# Patient Record
Sex: Female | Born: 1985 | Hispanic: Yes | Marital: Married | Smoking: Never smoker
Health system: Southern US, Community
[De-identification: ages and names within clinical notes are randomized; demographics above are authoritative.]

## PROBLEM LIST (undated history)

## (undated) DIAGNOSIS — Z8489 Family history of other specified conditions: Secondary | ICD-10-CM

## (undated) DIAGNOSIS — R011 Cardiac murmur, unspecified: Secondary | ICD-10-CM

## (undated) DIAGNOSIS — G43909 Migraine, unspecified, not intractable, without status migrainosus: Secondary | ICD-10-CM

## (undated) DIAGNOSIS — I10 Essential (primary) hypertension: Secondary | ICD-10-CM

## (undated) HISTORY — PX: EYE SURGERY: SHX253

## (undated) HISTORY — PX: CHOLECYSTECTOMY: SHX55

## (undated) HISTORY — PX: TUBAL LIGATION: SHX77

---

## 2012-07-19 ENCOUNTER — Ambulatory Visit: Payer: Self-pay | Admitting: Family Medicine

## 2012-07-19 LAB — URINALYSIS, COMPLETE
Bilirubin,UR: NEGATIVE
Glucose,UR: NEGATIVE mg/dL (ref 0–75)
Nitrite: POSITIVE
Ph: 6.5 (ref 4.5–8.0)
Specific Gravity: 1.02 (ref 1.003–1.030)

## 2012-07-21 LAB — URINE CULTURE

## 2012-11-04 ENCOUNTER — Emergency Department: Payer: Self-pay | Admitting: Emergency Medicine

## 2012-11-04 LAB — COMPREHENSIVE METABOLIC PANEL WITH GFR
Albumin: 3.8 g/dL
Alkaline Phosphatase: 100 U/L
Anion Gap: 4 — ABNORMAL LOW
BUN: 17 mg/dL
Bilirubin,Total: 0.3 mg/dL
Calcium, Total: 9.2 mg/dL
Chloride: 105 mmol/L
Co2: 28 mmol/L
Creatinine: 0.76 mg/dL
EGFR (African American): >60
EGFR (Non-African Amer.): >60
Glucose: 94 mg/dL
Osmolality: 275
Potassium: 3.9 mmol/L
SGOT(AST): 58 U/L — ABNORMAL HIGH
SGPT (ALT): 39 U/L
Sodium: 137 mmol/L
Total Protein: 7.7 g/dL

## 2012-11-04 LAB — CBC
HCT: 39.5 %
HGB: 13.6 g/dL
MCH: 29.7 pg
MCHC: 34.5 g/dL
MCV: 86 fL
Platelet: 286 x10 3/mm 3
RBC: 4.6 X10 6/mm 3
RDW: 13.1 %
WBC: 12.3 x10 3/mm 3 — ABNORMAL HIGH

## 2012-11-04 LAB — URINALYSIS, COMPLETE
Bilirubin,UR: NEGATIVE
Glucose,UR: NEGATIVE mg/dL
Ketone: NEGATIVE
Leukocyte Esterase: NEGATIVE
Nitrite: NEGATIVE
Ph: 6
Protein: NEGATIVE
RBC,UR: 8 /HPF
Specific Gravity: 1.023
Squamous Epithelial: 11
WBC UR: <1 /HPF

## 2012-11-04 LAB — LIPASE, BLOOD: Lipase: 110 U/L

## 2013-07-02 ENCOUNTER — Ambulatory Visit: Payer: Self-pay

## 2014-01-30 ENCOUNTER — Ambulatory Visit: Payer: Self-pay | Admitting: Surgery

## 2014-02-05 ENCOUNTER — Ambulatory Visit: Payer: Self-pay | Admitting: Surgery

## 2014-06-29 NOTE — Op Note (Signed)
PATIENT NAME:  Diane Sanchez, Diane Sanchez MR#:  [id number] DATE OF BIRTH:  1985/10/05  DATE OF PROCEDURE:  02/05/2014  PREOPERATIVE DIAGNOSIS:  Chronic cholecystitis/cholelithiasis.   POSTOPERATIVE DIAGNOSIS:  Chronic cholecystitis/cholelithiasis.   PROCEDURE:  Laparoscopic cholecystectomy, cholangiogram.   SURGEON:  Adella HareJ. Wilton Ferguson Gertner, MD   ANESTHESIA:  General.   INDICATION:  This 29 year old female has a history of epigastric pain and history of ultrasound findings of small gallstones. Surgery was recommended for definitive treatment.   DESCRIPTION OF PROCEDURE:  The patient was placed on the operating table in the supine position under general endotracheal anesthesia. The abdomen was prepared with ChloraPrep and draped in a sterile manner. A short incision was made in the inferior aspect of the umbilicus and carried down to the deep fascia, which was grasped with laryngeal hook and elevated. A Veress needle was inserted, aspirated, and irrigated with a saline solution. Next, the peritoneal cavity was inflated with carbon dioxide. The Veress needle was removed. The 10 mm cannula was inserted. The 10 mm, 0-degree laparoscope was inserted to view the peritoneal cavity. Another incision was made in the epigastrium slightly to the right of the midline to introduce an 11 mm cannula. Two incisions were made in the lateral aspect of the right upper quadrant to introduce two 5 mm cannulas.   Initial inspection revealed normal appearance of the liver and some thickening of the gallbladder wall. Other visible intestines appeared typical. The gallbladder was retracted towards the right shoulder. Multiple adhesions were taken down with blunt and sharp dissection and hook and cautery. The gallbladder neck was retracted inferiorly and laterally. The porta hepatis was observed. The cystic duct was dissected free from surrounding structures. The cystic artery was dissected free from surrounding structures. A critical view of  safety was demonstrated. An Endo Clip was placed across the cystic duct adjacent to the gallbladder neck. An incision was made in the cystic duct to introduce a Reddick catheter. Half-strength Conray-60 dye was injected as the cholangiogram was done with fluoroscopy, viewing the biliary tree and prompt flow of dye into the duodenum. No retained stones were seen. The Reddick catheter was removed. The cystic duct was doubly ligated with Endo Clips and divided. The cystic artery was controlled with Endo Clip and divided. The gallbladder was dissected free from the liver with use of hook and cautery. Bleeding was very minimal. Hemostasis was subsequently intact. The gallbladder was delivered up through the infraumbilical incision, opened and suctioned, removed, and submitted in formalin with palpable small stones for routine pathology. The right upper quadrant was further inspected. Hemostasis was intact. The cannulas were removed, allowing carbon dioxide to escape from the peritoneal cavity. Skin incisions were closed with interrupted 5-0 chromic subcuticular suture, benzoin, and Steri-Strips. Dressings were applied with paper tape. The patient tolerated the surgery satisfactorily and was prepared for transfer to the recovery room.    ____________________________ Shela CommonsJ. Renda RollsWilton Timesha Cervantez, MD jws:nb D: 02/05/2014 17:18:08 ET T: 02/06/2014 02:17:06 ET JOB#: [id number]  cc: Adella HareJ. Wilton Edan Juday, MD, <Dictator> Adella HareWILTON J Royer Cristobal MD ELECTRONICALLY SIGNED 02/12/2014 8:55

## 2014-07-01 LAB — SURGICAL PATHOLOGY

## 2014-09-02 IMAGING — CT CT HEAD WITHOUT CONTRAST
1 series · 16 of 30 positions shown, 20 images · non-contrast
Comparison: None.

REASON FOR EXAM: loc migraine
COMMENTS:

PROCEDURE:     JANNA - MALLELIS VALVUENA WITHOUT CONTRAST  - July 02, 2013  [DATE]
CLINICAL DATA: History migraines, headache
EXAM:
CT HEAD WITHOUT CONTRAST
TECHNIQUE: Contiguous axial images were obtained from the base of the skull
through the vertex without intravenous contrast.

[Series 2: head wo · axial · 0.40mm/px · z∈[+208,+353]mm · 16 of 33 slices shown, 20 images]
[im 2/33  brain]
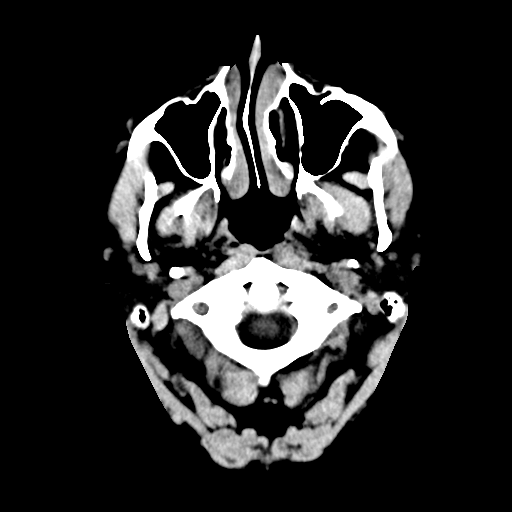
[im 2/33  bone]
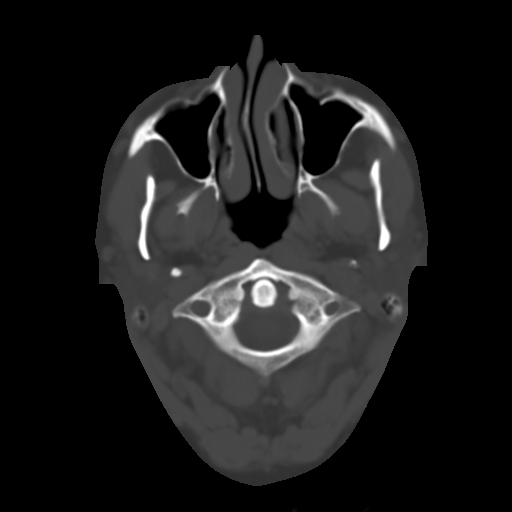
[im 4/33  brain]
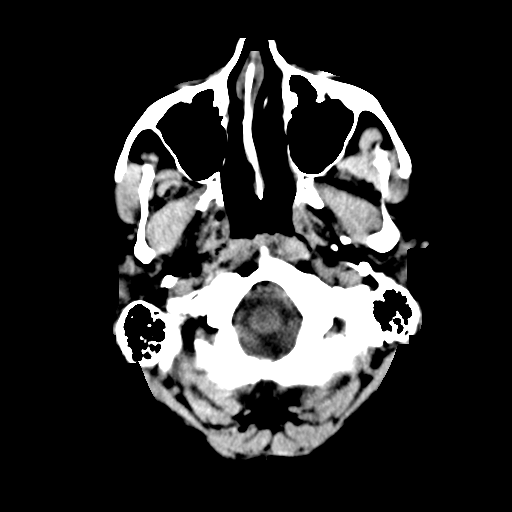
[im 6/33  brain]
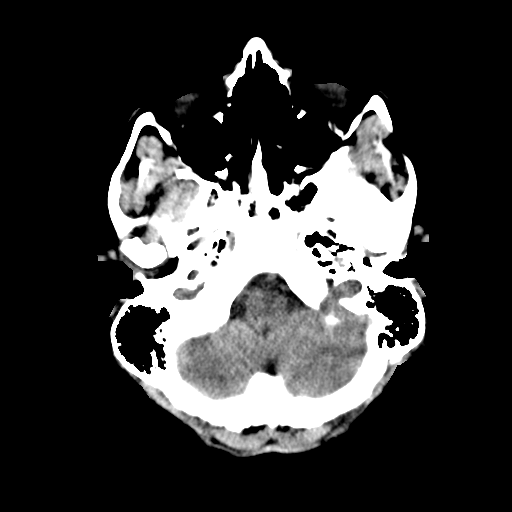
[im 8/33  brain]
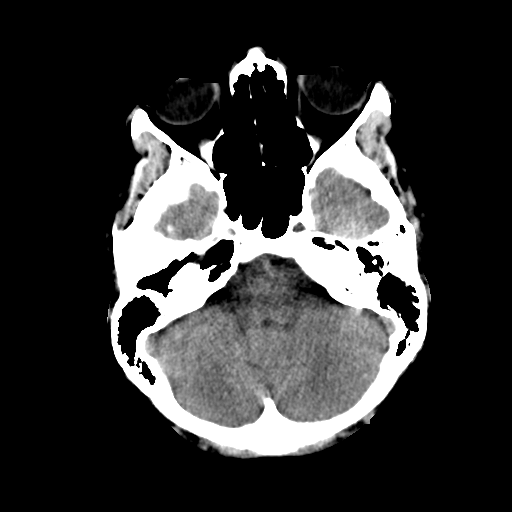
[im 9/33  brain]
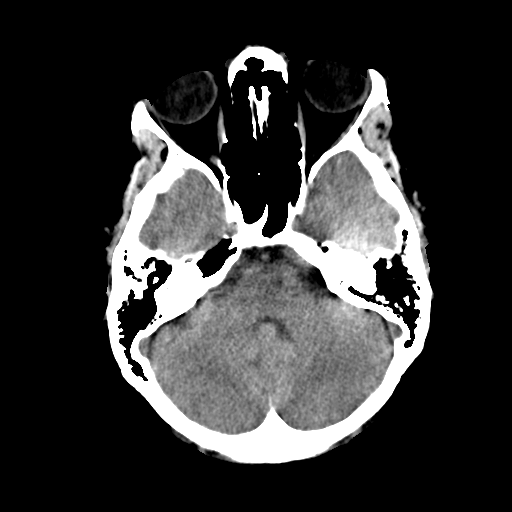
[im 9/33  bone]
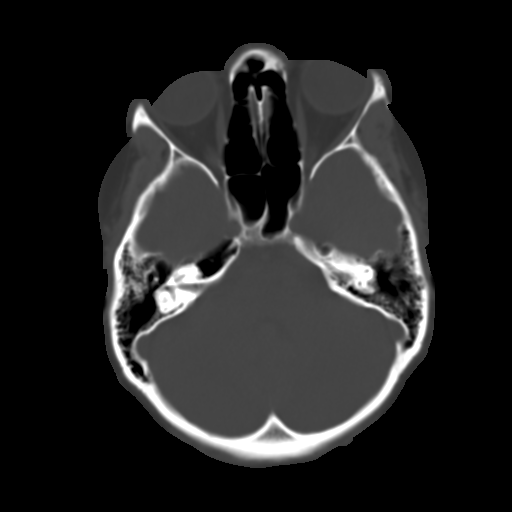
[im 12/33  brain]
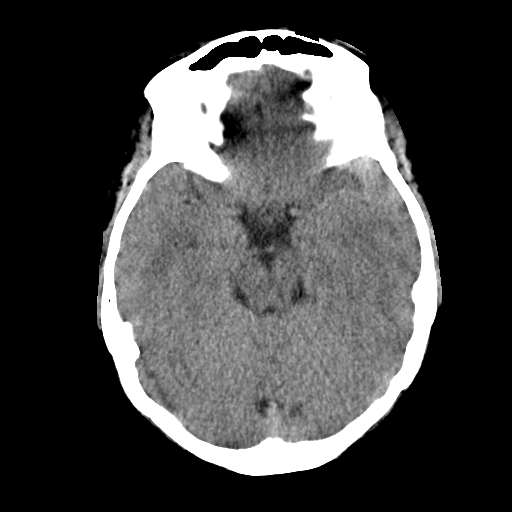
[im 14/33  brain]
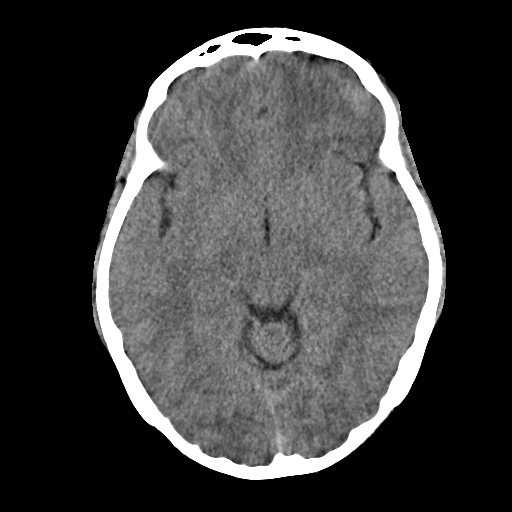
[im 16/33  brain]
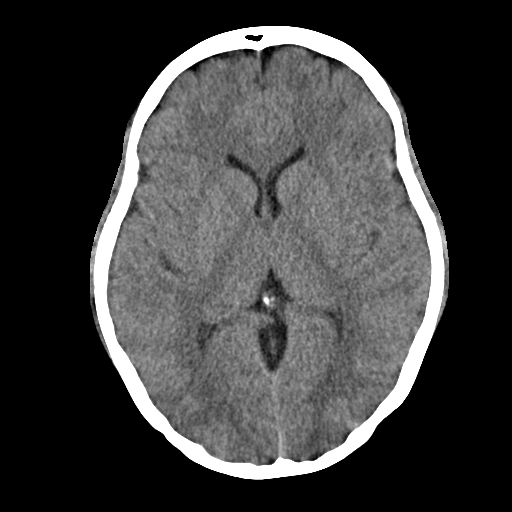
[im 17/33  brain]
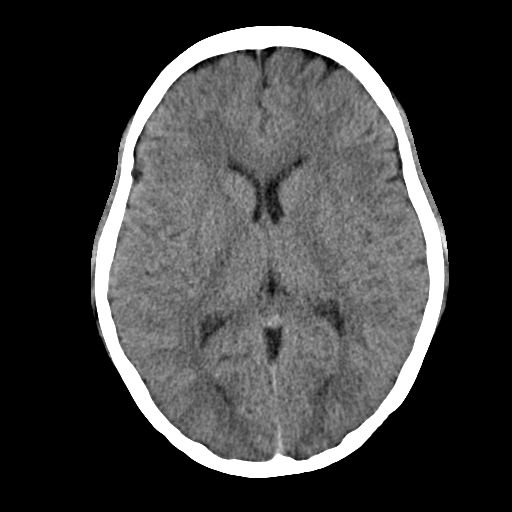
[im 17/33  bone]
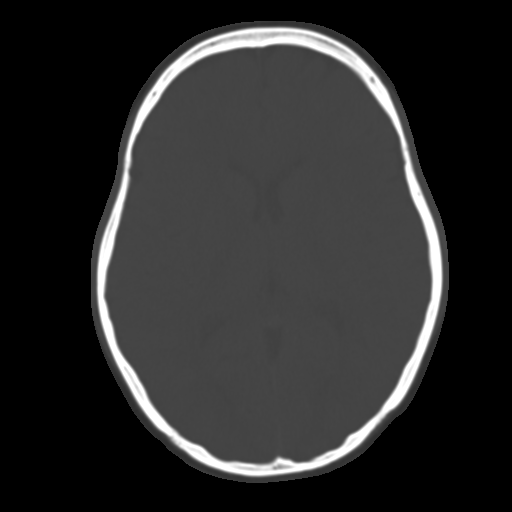
[im 19/33  brain]
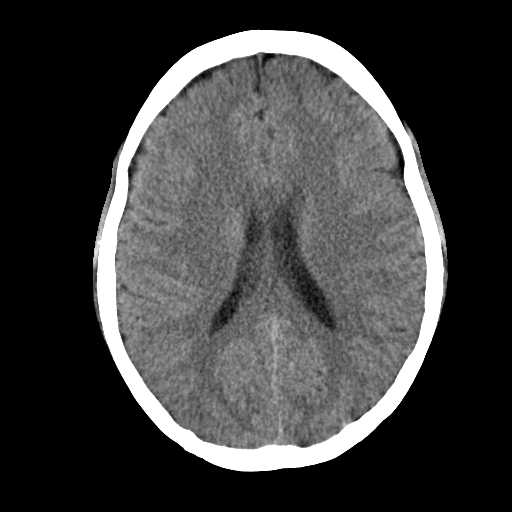
[im 21/33  brain]
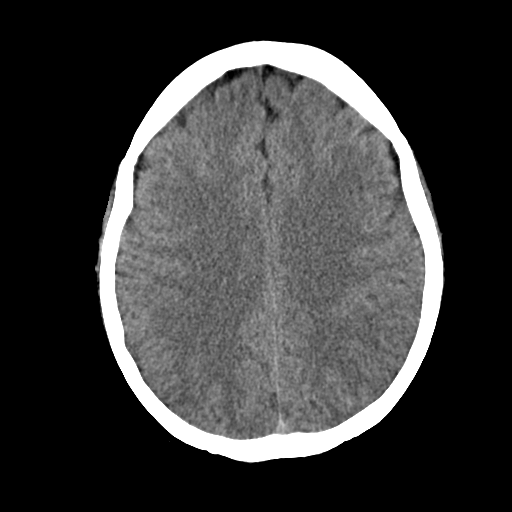
[im 24/33  brain]
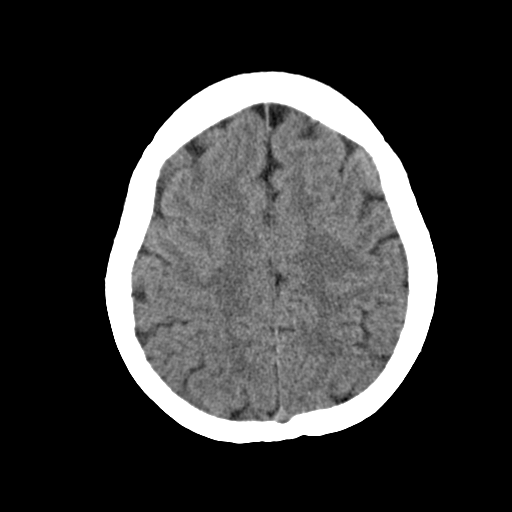
[im 25/33  brain]
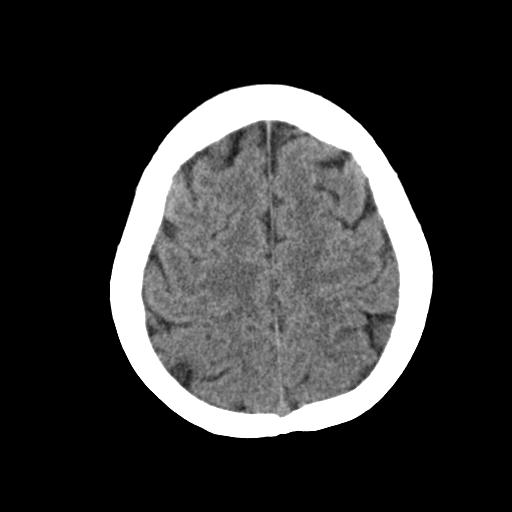
[im 25/33  bone]
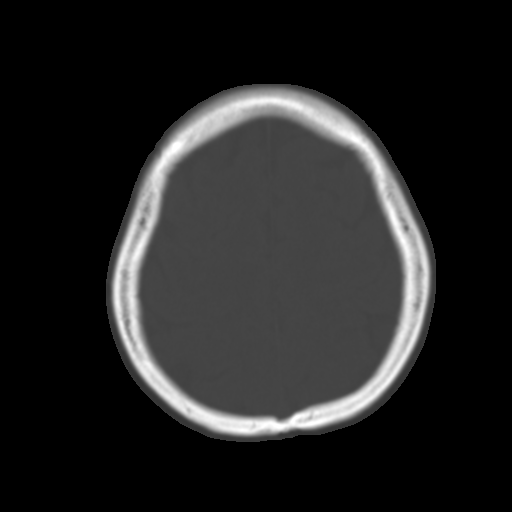
[im 27/33  brain]
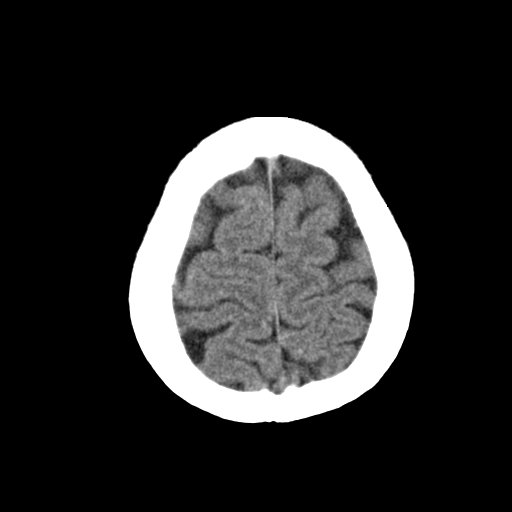
[im 29/33  brain]
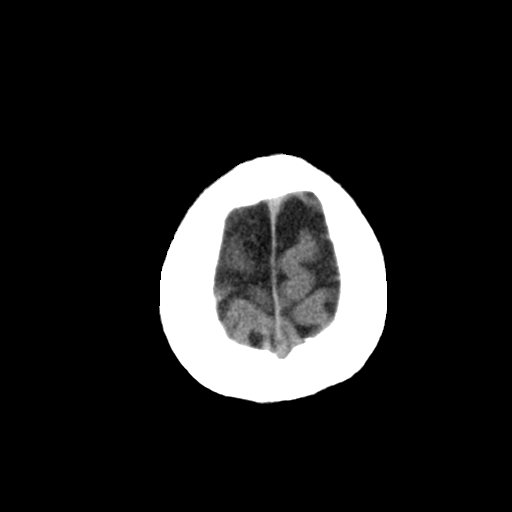
[im 31/33  brain]
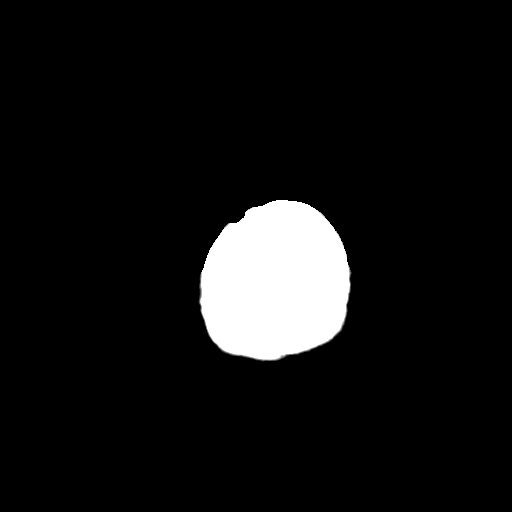

[16 of 30 positions shown; findings below may reference images not displayed]

FINDINGS: There is no evidence of mass effect, midline shift or extra-axial
fluid collections. There is no evidence of a space-occupying lesion
or intracranial hemorrhage. There is no evidence of a cortical-based
area of acute infarction.

The ventricles and sulci are appropriate for the patient's age. The
basal cisterns are patent.

Visualized portions of the orbits are unremarkable. The visualized
portions of the paranasal sinuses and mastoid air cells are
unremarkable.

The osseous structures are unremarkable.
IMPRESSION: No acute intracranial pathology.

## 2015-04-08 IMAGING — CR DG CHOLANGIOGRAM OPERATIVE
1 series · 3 of 3 positions shown · non-contrast
Comparison: None.

CLINICAL DATA: Cholelithiasis.

EXAM:
INTRAOPERATIVE CHOLANGIOGRAM
TECHNIQUE: Cholangiographic images from the C-arm fluoroscopic device were
submitted for interpretation post-operatively. Please see the
procedural report for the amount of contrast and the fluoroscopy
time utilized.

[Series 6001: s1 · 3 of 3 slices shown]
[im 1/3]
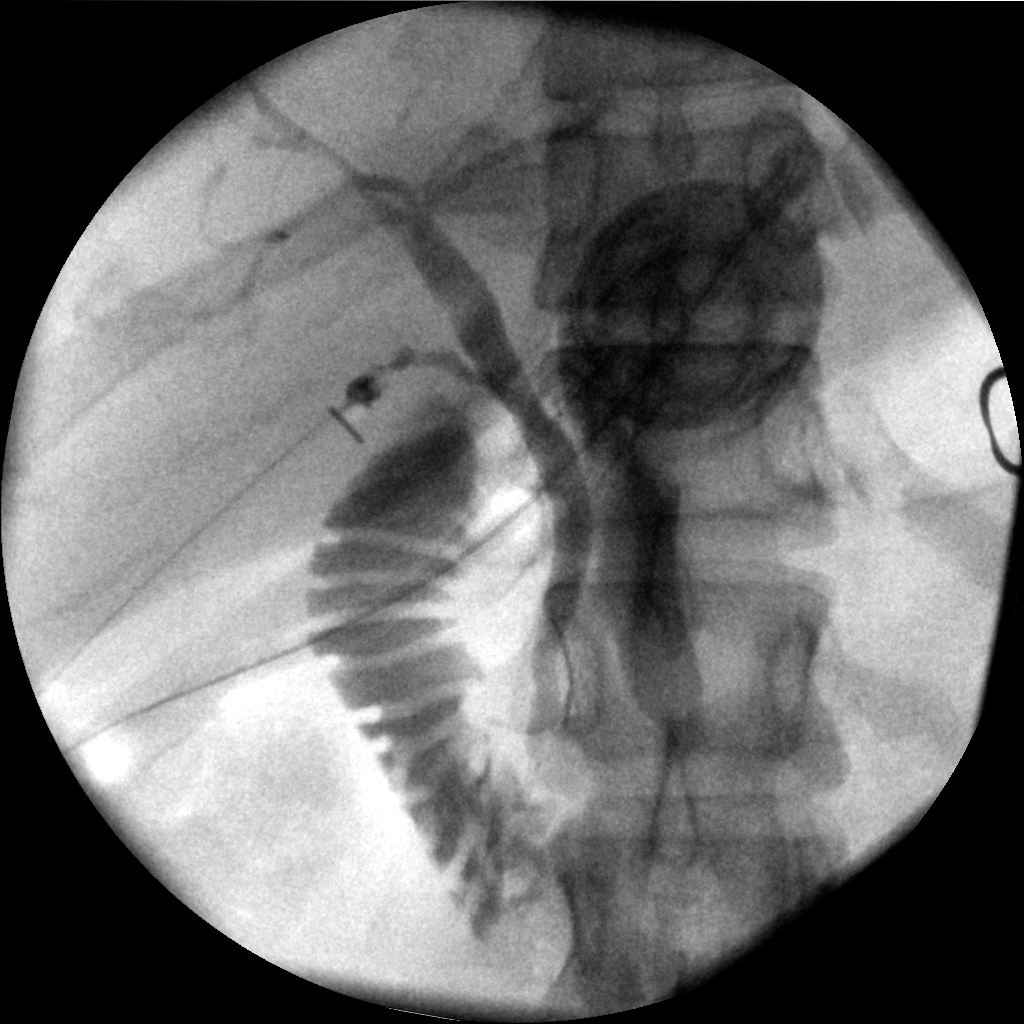
[im 2/3]
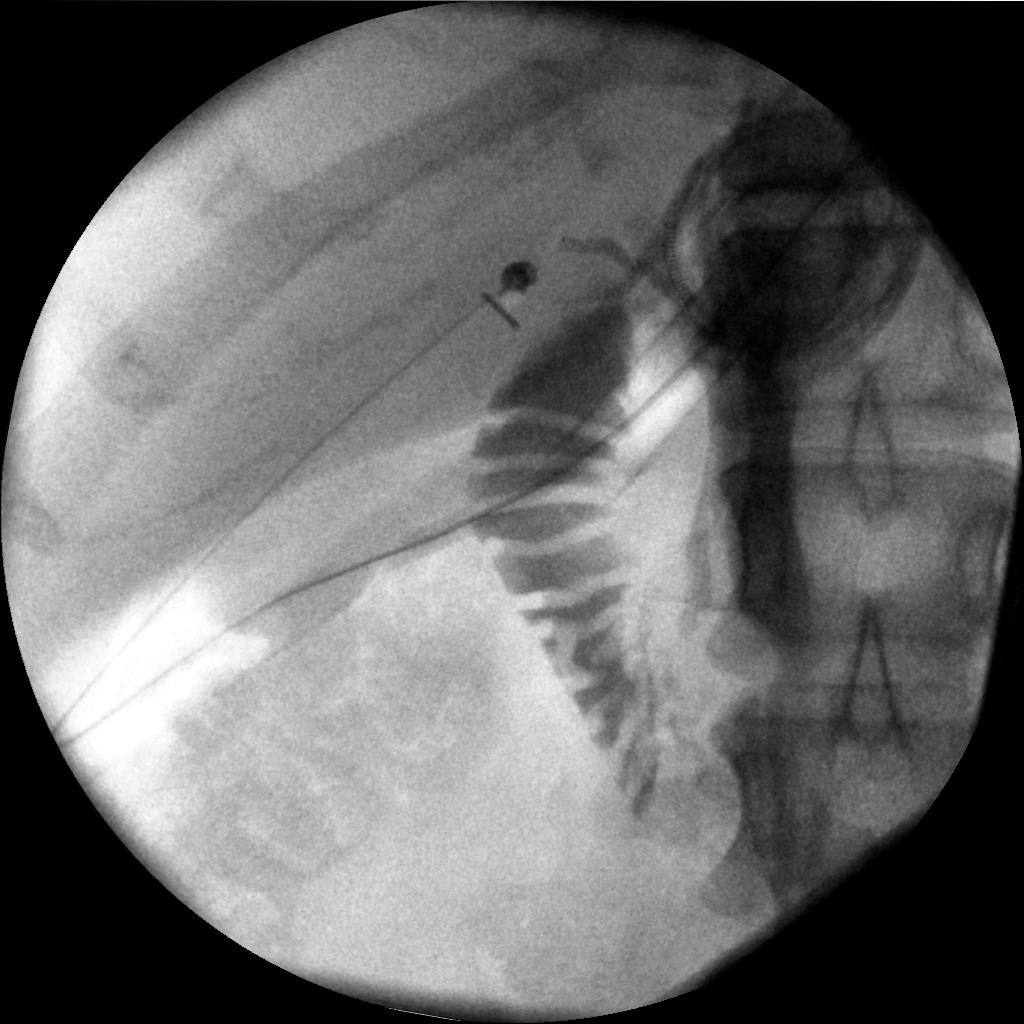
[im 3/3]
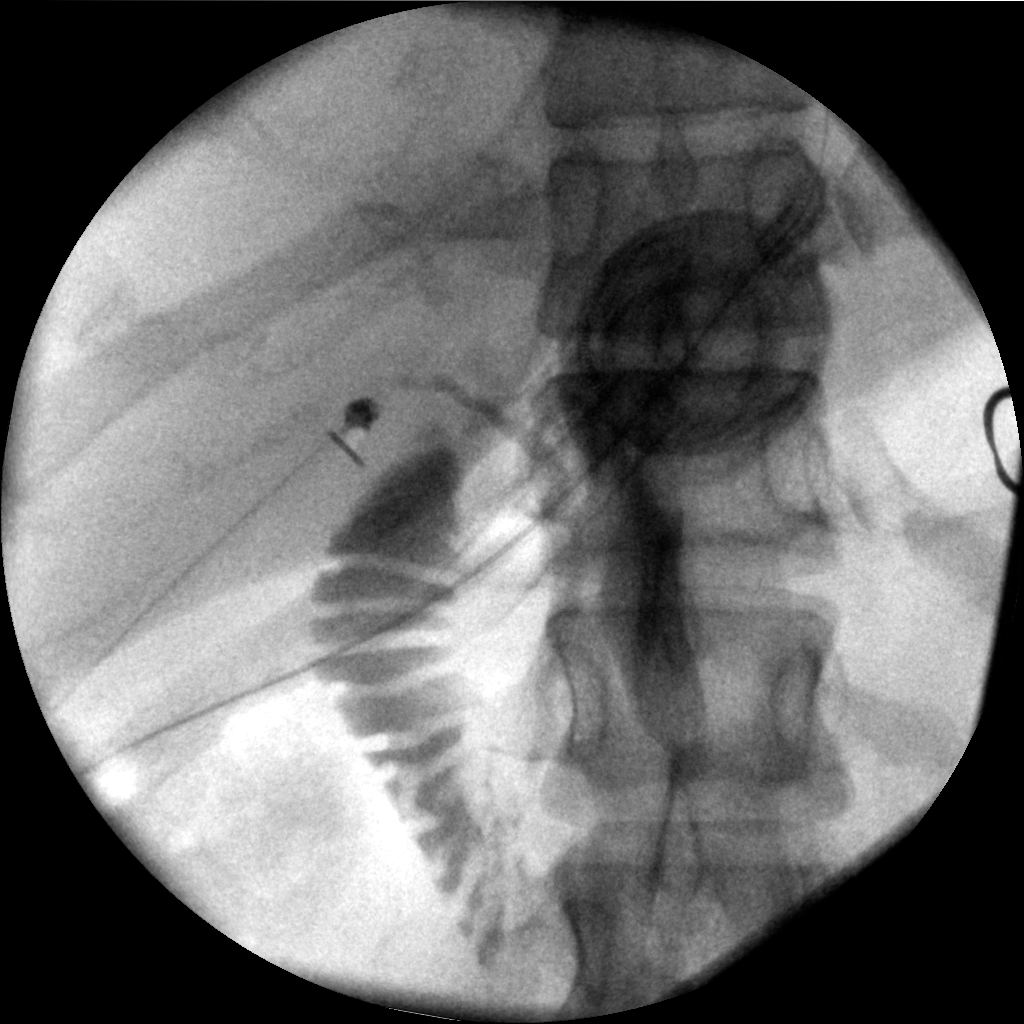

[3 of 3 positions shown; findings below may reference images not displayed]

FINDINGS: A total of 3 images are provided. I was not present during the
injection.

The series of images appears to demonstrate cannulation and
injection of the cystic duct remnant. There is contrast medium in
the bowel on all 3 images. The third image demonstrates
opacification of the common bile duct, common hepatic duct, and
proximal intrahepatic biliary tree without a well seen filling
defect.
IMPRESSION: 1. No definite filling defect identified on the third image, which
is the image which demonstrates a reasonably well opacified
extrahepatic biliary system.

## 2020-06-18 ENCOUNTER — Other Ambulatory Visit: Payer: Self-pay | Admitting: Podiatry

## 2020-06-19 ENCOUNTER — Encounter: Payer: Self-pay | Admitting: Podiatry

## 2020-06-19 ENCOUNTER — Other Ambulatory Visit: Payer: Self-pay

## 2020-06-24 ENCOUNTER — Other Ambulatory Visit: Admission: RE | Admit: 2020-06-24 | Payer: 59 | Source: Ambulatory Visit

## 2020-07-17 ENCOUNTER — Encounter: Payer: Self-pay | Admitting: Podiatry

## 2020-07-17 ENCOUNTER — Ambulatory Visit
Admission: RE | Disposition: A | Discharge status: Home / Self Care | Payer: Self-pay | Source: Home / Self Care | Attending: Podiatry

## 2020-07-17 ENCOUNTER — Ambulatory Visit: Payer: 59 | Admitting: Anesthesiology

## 2020-07-17 ENCOUNTER — Ambulatory Visit
Admission: RE | Admit: 2020-07-17 | Discharge: 2020-07-17 | Disposition: A | Discharge status: Home / Self Care | Payer: 59 | Attending: Podiatry | Admitting: Podiatry

## 2020-07-17 ENCOUNTER — Other Ambulatory Visit: Payer: Self-pay

## 2020-07-17 DIAGNOSIS — Z09 Encounter for follow-up examination after completed treatment for conditions other than malignant neoplasm: Secondary | ICD-10-CM

## 2020-07-17 DIAGNOSIS — Z9049 Acquired absence of other specified parts of digestive tract: Secondary | ICD-10-CM | POA: Diagnosis not present

## 2020-07-17 DIAGNOSIS — M2012 Hallux valgus (acquired), left foot: Secondary | ICD-10-CM | POA: Insufficient documentation

## 2020-07-17 DIAGNOSIS — Z79899 Other long term (current) drug therapy: Secondary | ICD-10-CM | POA: Insufficient documentation

## 2020-07-17 HISTORY — DX: Cardiac murmur, unspecified: R01.1

## 2020-07-17 HISTORY — DX: Family history of other specified conditions: Z84.89

## 2020-07-17 HISTORY — PX: BUNIONECTOMY: SHX129

## 2020-07-17 HISTORY — DX: Migraine, unspecified, not intractable, without status migrainosus: G43.909

## 2020-07-17 LAB — POCT PREGNANCY, URINE: Preg Test, Ur: NEGATIVE

## 2020-07-17 SURGERY — BUNIONECTOMY
Anesthesia: General | Site: Toe | Laterality: Left

## 2020-07-17 MED ORDER — POVIDONE-IODINE 7.5 % EX SOLN: Freq: Once | CUTANEOUS | Status: AC

## 2020-07-17 MED ORDER — ONDANSETRON HCL 4 MG/2ML IJ SOLN: 4.0000 mg | Freq: Once | INTRAMUSCULAR | Status: DC | PRN

## 2020-07-17 MED ORDER — CEPHALEXIN 500 MG PO CAPS: 500.0000 mg | ORAL_CAPSULE | Freq: Three times a day (TID) | ORAL | 0 refills | Status: AC

## 2020-07-17 MED ORDER — ASPIRIN EC 81 MG PO TBEC: 81.0000 mg | DELAYED_RELEASE_TABLET | Freq: Two times a day (BID) | ORAL | 0 refills | Status: AC

## 2020-07-17 MED ORDER — SCOPOLAMINE 1 MG/3DAYS TD PT72
1.0000 | MEDICATED_PATCH | Freq: Once | TRANSDERMAL | Status: DC
  Administered 2020-07-17: 1.5 mg via TRANSDERMAL

## 2020-07-17 MED ORDER — ONDANSETRON HCL 4 MG PO TABS: 4.0000 mg | ORAL_TABLET | Freq: Three times a day (TID) | ORAL | 0 refills | Status: AC | PRN

## 2020-07-17 MED ORDER — FENTANYL CITRATE (PF) 100 MCG/2ML IJ SOLN: 25.0000 ug | INTRAMUSCULAR | Status: DC | PRN

## 2020-07-17 MED ORDER — FENTANYL CITRATE (PF) 100 MCG/2ML IJ SOLN
INTRAMUSCULAR | Status: DC | PRN
  Administered 2020-07-17: 25 ug via INTRAVENOUS
  Administered 2020-07-17: 50 ug via INTRAVENOUS
  Administered 2020-07-17: 25 ug via INTRAVENOUS

## 2020-07-17 MED ORDER — OXYCODONE HCL 5 MG/5ML PO SOLN: 5.0000 mg | Freq: Once | ORAL | Status: DC | PRN

## 2020-07-17 MED ORDER — LIDOCAINE HCL (CARDIAC) PF 100 MG/5ML IV SOSY
PREFILLED_SYRINGE | INTRAVENOUS | Status: DC | PRN
  Administered 2020-07-17: 50 mg via INTRATRACHEAL

## 2020-07-17 MED ORDER — ACETAMINOPHEN 325 MG PO TABS
325.0000 mg | ORAL_TABLET | ORAL | Status: DC | PRN
Start: 2020-07-17 — End: 2020-07-17

## 2020-07-17 MED ORDER — OXYCODONE HCL 5 MG PO TABS: 5.0000 mg | ORAL_TABLET | Freq: Once | ORAL | Status: DC | PRN

## 2020-07-17 MED ORDER — ACETAMINOPHEN 160 MG/5ML PO SOLN: 325.0000 mg | ORAL | Status: DC | PRN

## 2020-07-17 MED ORDER — CEFAZOLIN SODIUM-DEXTROSE 2-4 GM/100ML-% IV SOLN
2.0000 g | INTRAVENOUS | Status: AC
  Administered 2020-07-17: 2 g via INTRAVENOUS

## 2020-07-17 MED ORDER — GLYCOPYRROLATE 0.2 MG/ML IJ SOLN
INTRAMUSCULAR | Status: DC | PRN
  Administered 2020-07-17: .1 mg via INTRAVENOUS

## 2020-07-17 MED ORDER — BUPIVACAINE LIPOSOME 1.3 % IJ SUSP
INTRAMUSCULAR | Status: DC | PRN
  Administered 2020-07-17: 5 mL
  Administered 2020-07-17: 10 mL

## 2020-07-17 MED ORDER — MIDAZOLAM HCL 5 MG/5ML IJ SOLN
INTRAMUSCULAR | Status: DC | PRN
  Administered 2020-07-17: 2 mg via INTRAVENOUS

## 2020-07-17 MED ORDER — LACTATED RINGERS IV SOLN: INTRAVENOUS | Status: DC

## 2020-07-17 MED ORDER — ONDANSETRON HCL 4 MG/2ML IJ SOLN
INTRAMUSCULAR | Status: DC | PRN
  Administered 2020-07-17: 4 mg via INTRAVENOUS

## 2020-07-17 MED ORDER — HYDROCODONE-ACETAMINOPHEN 7.5-325 MG PO TABS: 1.0000 | ORAL_TABLET | Freq: Four times a day (QID) | ORAL | 0 refills | Status: AC | PRN

## 2020-07-17 MED ORDER — DEXAMETHASONE SODIUM PHOSPHATE 4 MG/ML IJ SOLN
INTRAMUSCULAR | Status: DC | PRN
  Administered 2020-07-17: 4 mg via INTRAVENOUS

## 2020-07-17 MED ORDER — PROPOFOL 10 MG/ML IV BOLUS
INTRAVENOUS | Status: DC | PRN
  Administered 2020-07-17: 150 mg via INTRAVENOUS
  Administered 2020-07-17: 20 mg via INTRAVENOUS
  Administered 2020-07-17: 30 mg via INTRAVENOUS

## 2020-07-17 MED ORDER — ASPIRIN EC 81 MG PO TBEC: 81.0000 mg | DELAYED_RELEASE_TABLET | Freq: Two times a day (BID) | ORAL | 0 refills | Status: DC

## 2020-07-17 MED ORDER — BUPIVACAINE HCL 0.25 % IJ SOLN
INTRAMUSCULAR | Status: DC | PRN
  Administered 2020-07-17: 10 mL

## 2020-07-17 SURGICAL SUPPLY — 43 items
BIT DRILL 1.7 LNG CANN (DRILL) ×2 IMPLANT
BLADE MED AGGRESSIVE (BLADE) ×2 IMPLANT
BLADE OSC/SAGITTAL MD 5.5X18 (BLADE) ×2 IMPLANT
BNDG CMPR STD VLCR NS LF 5.8X4 (GAUZE/BANDAGES/DRESSINGS) ×1
BNDG CMPR STD VLCR NS LF 5.8X6 (GAUZE/BANDAGES/DRESSINGS) ×1
BNDG ELASTIC 4X5.8 VLCR NS LF (GAUZE/BANDAGES/DRESSINGS) ×2 IMPLANT
BNDG ELASTIC 6X5.8 VLCR NS LF (GAUZE/BANDAGES/DRESSINGS) ×2 IMPLANT
BNDG ESMARK 4X12 TAN STRL LF (GAUZE/BANDAGES/DRESSINGS) ×2 IMPLANT
BNDG GAUZE 4.5X4.1 6PLY STRL (MISCELLANEOUS) ×2 IMPLANT
CANISTER SUCT 1200ML W/VALVE (MISCELLANEOUS) ×2 IMPLANT
COUNTERSINK HEADED 2.5 (ORTHOPEDIC DISPOSABLE SUPPLIES) ×2
COVER LIGHT HANDLE FLEXIBLE (MISCELLANEOUS) ×4 IMPLANT
CUFF TOURN SGL QUICK 18X4 (TOURNIQUET CUFF) ×2 IMPLANT
DRAPE FLUOR MINI C-ARM 54X84 (DRAPES) ×2 IMPLANT
DURAPREP 26ML APPLICATOR (WOUND CARE) ×2 IMPLANT
ELECT REM PT RETURN 9FT ADLT (ELECTROSURGICAL) ×2
ELECTRODE REM PT RTRN 9FT ADLT (ELECTROSURGICAL) ×1 IMPLANT
GAUZE SPONGE 4X4 12PLY STRL (GAUZE/BANDAGES/DRESSINGS) ×2 IMPLANT
GAUZE XEROFORM 1X8 LF (GAUZE/BANDAGES/DRESSINGS) ×2 IMPLANT
GLOVE SURG ENC MOIS LTX SZ7 (GLOVE) ×2 IMPLANT
GLOVE SURG POLYISO LF SZ7 (GLOVE) ×2 IMPLANT
GOWN STRL REUS W/ TWL LRG LVL3 (GOWN DISPOSABLE) ×2 IMPLANT
GOWN STRL REUS W/TWL LRG LVL3 (GOWN DISPOSABLE) ×4
KIT PROCEDURE DRILL (DRILL) ×2 IMPLANT
KIT TURNOVER KIT A (KITS) ×2 IMPLANT
NS IRRIG 500ML POUR BTL (IV SOLUTION) ×2 IMPLANT
PACK EXTREMITY ARMC (MISCELLANEOUS) ×2 IMPLANT
PADDING CAST BLEND 4X4 NS (MISCELLANEOUS) ×8 IMPLANT
PENCIL SMOKE EVACUATOR (MISCELLANEOUS) ×2 IMPLANT
SCREW 2.5X24 HEADED (Screw) ×2 IMPLANT
SCREW COUNTERSINK HEADED 2.5 (ORTHOPEDIC DISPOSABLE SUPPLIES) ×1 IMPLANT
SPLINT CAST 1 STEP 4X30 (MISCELLANEOUS) ×2 IMPLANT
STAPLE DYNACLIP 8X8 (Staple) ×2 IMPLANT
STOCKINETTE IMPERVIOUS LG (DRAPES) ×2 IMPLANT
SUT ETHILON 4-0 (SUTURE) ×4
SUT ETHILON 4-0 FS2 18XMFL BLK (SUTURE) ×2
SUT VIC AB 4-0 FS2 27 (SUTURE) ×2 IMPLANT
SUT VIC AB 4-0 RB1 27 (SUTURE) ×2
SUT VIC AB 4-0 RB1 27X BRD (SUTURE) ×1 IMPLANT
SUT VIC AB 4-0 SH 27 (SUTURE) ×2
SUT VIC AB 4-0 SH 27XANBCTRL (SUTURE) ×1 IMPLANT
SUTURE ETHLN 4-0 FS2 18XMF BLK (SUTURE) ×2 IMPLANT
WIRE SMOOTH TROCAR .9MMX150MML (WIRE) ×4 IMPLANT

## 2020-07-17 NOTE — Op Note (Signed)
PODIATRY / FOOT AND ANKLE SURGERY OPERATIVE REPORT    SURGEON: Caroline More, DPM  PRE-OPERATIVE DIAGNOSIS:  1.  Left hallux valgus 2.  Pain left foot  POST-OPERATIVE DIAGNOSIS: Same  PROCEDURE(S): Left Austin bunionectomy with Akin osteotomy  HEMOSTASIS: Left ankle tourniquet  ANESTHESIA: MAC  ESTIMATED BLOOD LOSS: 10 cc  FINDING(S): 1.  Hallux valgus contracture left foot with increased hallux interphalangeus angle and first intermetatarsal space angle.  PATHOLOGY/SPECIMEN(S): None  INDICATIONS:   Diane Sanchez is a 35 y.o. female who presents with a painful bunion to the left foot.  Patient has tried conservative measures but has not improved.  She has tried changing shoes, orthoses, taping, padding, strapping, oral medications but has failed to improve.  X-ray imaging was taken prior to surgery today showing increased intermetatarsal space angle to approximately 11 degrees and increased hallux interphalangeus angle with increased medial eminence as well.  Discussed all treatment options with patient both conservative and surgical attempts at correction at this time patient is elected for surgical procedure consisting of left Austin bunionectomy with Akin osteotomy.  Postoperative course discussed in detail as well.  All questions answered.  No guarantees given.  Consent signed and placed in chart..  DESCRIPTION: After obtaining full informed written consent, the patient was brought back to the operating room and placed supine upon the operating table.  The patient received IV antibiotics prior to induction.  After obtaining adequate anesthesia, a block was performed with a mix of 10 cc of half percent Marcaine plain and 10 cc of Exparel about the left first ray in a Mayo type block and first interspace.  The patient was prepped and draped in the standard fashion.  An Esmarch bandage was used to exsanguinate the left lower extremity and pneumatic ankle tourniquet was  inflated.  Attention was directed to the left first metatarsal phalangeal joint at the dorsal medial aspect where a linear longitudinal incision was made medial to the tendon of the extensor hallucis longus and involved the contour of the deformity.  The incision was deepened through the subcutaneous tissues utilizing sharp blunt dissection care was taken to identify and retract all vital neurovascular structures all venous contributories were cauterized necessary.  At this time the capsule was then incised medial to the tendon of the extensor hallucis longus and once again involved the contour deformity where the capsular and periosteal tissues were reflected medially and laterally thereby exposing the first metatarsal phalangeal joint at the operative site as well as the base of the proximal phalanx and midshaft portion of the proximal phalanx of the hallux.  At this time the medial eminence was then resected and passed off the operative site.  A capsular release was then performed a lateral release was performed at the lateral aspect of the joint through the same incision while retracting the extensor hallucis longus tendon medially and the skin and subcutaneous tissue laterally.  The conjoined tendon of the abductor hallucis was resected as well as the lateral capsular tissues completing the lateral release.  Attention was then directed to the head of the first metatarsal where the osteotomy site was marked.  A Austin osteotomy was performed leaving a slightly longer plantar arm the dorsal arm.  The capital fragment was shifted laterally approximately 5 mm and then held into place with a guidewire for a 2.7 mm cannulated Paragon 28 screw.  C-arm imaging was utilized to verify correct position which appeared to be excellent as the intermetatarsal space angle appeared to  be well reduced.  The sesamoids also appear to be underneath the first metatarsal head and the hallux appeared to be in a fairly rectus  position but still had increased hallux interphalangeus angle indicating that likely in Akin osteotomy as needed.  Once the guidewire was verified to be in the correct position under fluoroscopic guidance utilizing standard AO principles and techniques a 2.7 x 24 mm cannulated Paragon 28 partially-threaded headed screw was then placed across the osteotomy site with excellent compression noted.  The guidewire was removed and passed off the operative site.  The medial overhang was then resected with a sagittal bone saw in the medial area was then smoothed to a smooth contour with the sagittal bone saw.  These bone fragments were passed off the operative site.  Surgical site was flushed with copious amounts normal sterile saline.   Attention was then directed to the hallux at the proximal phalanx midshaft portion were dissection was performed previously and an Akin type osteotomy was performed keeping the lateral hinge intact removing a 3 to 4 mm bone fragment.  This was done with the aid of a guidepin.  The sagittal bone saw was used to complete the osteotomy and the bone fragment was removed along with a guidepin and then the cut was then finished with the sagittal bone saw the wall trying to push the toe into a reduced position.  The lateral hinge remained intact during this portion of the case.  At this time an 8 mm Paragon 28 staple was then placed from dorsal to plantar across the osteotomy site while holding the toe in a reduced position.  It appeared to sit excellently and had excellent compression across the osteotomy site.  With the foot loaded the toe appeared to sit in a rectus position overall with great reduction of the medial eminence and the hallux appeared to be in a fairly straight position overall.  The surgical sites were flushed with copious amounts normal sterile saline.  The capsular and periosteal tissue was then reapproximated well coapted with the toe held in a rectus position with 3-0  Vicryl.  Final C-arm imaging was then taken showing rectus position of the hallux with reduction of the first intermetatarsal space angle and hallux interphalangeus angle with hardware intact.  The osteotomies appear to be well compressed overall.  Subcutaneous tissues and reapproximated well coapted with 4-0 Vicryl and the skin was then reapproximated well coapted with 4-0 nylon in a combination of simple and horizontal mattress type stitching.  An additional 5 cc of Exparel was injected about the incision site and operative sites.  The pneumatic ankle tourniquet was deflated and a prompt hyperemic response was noted all digits left foot.  A postoperative dressing was applied consisting of Xeroform to the incision line followed by 4 x 4 gauze, Kling, Kerlix, Ace wrap, patient was placed into a tall cam boot.  The patient tolerated the procedure and anesthesia well was transferred to recovery room vital signs stable vascular status intact all toes left foot.  Following.  Postoperative monitoring patient will be discharged home with the appropriate orders, medications, and follow-up instructions.  Patient is to remain partial weightbearing with a cam boot with heel contact for short distances only and try to stay off the foot is much as possible.  Discussed this postoperatively with husband in detail.  Patient to follow-up in clinic within 1 week of surgical date.  COMPLICATIONS: None  CONDITION: Good, stable  Caroline More, DPM

## 2020-07-17 NOTE — H&P (Signed)
PODIATRY / FOOT AND ANKLE SURGERY H&P  Chief Complaint: Left foot bunion pain   HPI: Diane Sanchez is a 35 y.o. female who presents with pain to the left first metatarsal phalangeal joint area bony deformity.  Patient has exhausted conservative measures consisting of changes in shoes, orthoses, padding, strapping, taping, injections, changes in activity but her symptoms have still been present and she still continued to have pain at the level of the first metatarsal phalangeal joint near the bunion.  Patient presents today for surgical intervention.  PMHx:  Past Medical History:  Diagnosis Date  . Family history of adverse reaction to anesthesia    Mother - palpataions  . Heart murmur    childhood  . Migraine headache    chronic    Surgical Hx:  Past Surgical History:  Procedure Laterality Date  . CESAREAN SECTION    . CHOLECYSTECTOMY    . EYE SURGERY     Retina surgery  . TUBAL LIGATION      FHx: History reviewed. No pertinent family history.  Social History:  reports that she has never smoked. She has never used smokeless tobacco. She reports previous alcohol use. No history on file for drug use.  Allergies: No Known Allergies  Review of Systems: General ROS: negative Respiratory ROS: no cough, shortness of breath, or wheezing Cardiovascular ROS: no chest pain or dyspnea on exertion Gastrointestinal ROS: no abdominal pain, change in bowel habits, or black or bloody stools Musculoskeletal ROS: positive for - joint pain and joint swelling Neurological ROS: negative Dermatological ROS: negative  Medications Prior to Admission  Medication Sig Dispense Refill  . norethindrone-ethinyl estradiol-iron (ESTROSTEP FE) 1-20/1-30/1-35 MG-MCG tablet Take 1 tablet by mouth daily.    Marland Kitchen omeprazole (PRILOSEC) 40 MG capsule Take 40 mg by mouth daily as needed.    . rizatriptan (MAXALT) 10 MG tablet Take 10 mg by mouth as needed for migraine. May repeat in 2 hours if needed    .  SUMAtriptan (IMITREX) 25 MG tablet Take 25 mg by mouth every 2 (two) hours as needed for migraine. May repeat in 2 hours if headache persists or recurs.    . topiramate (TOPAMAX) 100 MG tablet Take 100 mg by mouth 2 (two) times daily as needed.      Physical Exam: General: Alert and oriented.  No apparent distress.  Vascular: DP/PT pulses palpable bilateral, capillary fill time intact to digits bilateral.  Minimal bilateral lower extremity nonpitting edema present.  Neuro: Light touch sensation intact to digits bilateral.  Derm: No openings or skin tears or ulcerations present to bilateral lower extremities.  MSK: Pain on palpation to the left first metatarsal phalangeal joint in the area of painful bunion prominence.  Enlarged medial eminence present.  Some pain with range of motion of the first metatarsal phalangeal joint at the medial eminence level.  Hallux valgus contracture noted to the left first metatarsal phalangeal joint with a slight increase in hallux interphalangeus angle.  X-ray: Left foot 3 views increased intermetatarsal space angle first to approximately 11 to 12 degrees.  Increased hallux interphalangeus angle.  Fairly rectus foot type overall with slightly elongated second metatarsal.  No acute pathology noted.  Results for orders placed or performed during the hospital encounter of 07/17/20 (from the past 48 hour(s))  Pregnancy, urine POC     Status: None   Collection Time: 07/17/20  9:26 AM  Result Value Ref Range   Preg Test, Ur NEGATIVE NEGATIVE    Comment:  THE SENSITIVITY OF THIS METHODOLOGY IS >24 mIU/mL    No results found.  Blood pressure (!) 154/96, pulse 66, temperature 98.2 F (36.8 C), temperature source Temporal, resp. rate 18, height 5\' 4"  (1.626 m), weight 94.8 kg, last menstrual period 04/22/2020, SpO2 98 %.  Assessment 1. Left hallux valgus with hallux interphalangeus 2. Left foot pain  Plan -Patient seen and examined -All treatment  options were discussed with the patient both conservative and surgical attempts at correction including potential risks and complications.  At this time patient has elected for surgical procedure consisting of left Austin bunionectomy with Akin osteotomy.  All questions answered including postoperative course in detail.  No guarantees given.  Consent updated today and placed in chart. -Patient to follow-up 1 week after surgery and was instructed to be partial weightbearing with heel contact in a boot but try to stay off the left foot is much as possible postoperatively.  04/24/2020 07/17/2020, 10:39 AM

## 2020-07-17 NOTE — Transfer of Care (Signed)
Immediate Anesthesia Transfer of Care Note  Patient: Kanika Mikaelyn Arthurs  Procedure(s) Performed: [name]- LEFT with Akin osteotomy (Left Toe)  Patient Location: PACU  Anesthesia Type: General LMA  Level of Consciousness: awake, alert  and patient cooperative  Airway and Oxygen Therapy: Patient Spontanous Breathing and Patient connected to supplemental oxygen  Post-op Assessment: Post-op Vital signs reviewed, Patient's Cardiovascular Status Stable, Respiratory Function Stable, Patent Airway and No signs of Nausea or vomiting  Post-op Vital Signs: Reviewed and stable  Complications: No complications documented.

## 2020-07-17 NOTE — Discharge Instructions (Signed)
[facility]  POST OPERATIVE INSTRUCTIONS FOR DR. TROXLER, DR. Ether GriffinsFOWLER, AND [facility]. [facility]   1. Take your medication as prescribed.  Pain medication should be taken only as needed.  You may take ibuprofen or Tylenol as well to supplement with pain medicine.  If still having severe pain afterwards recommend taking 1 pain pill every 4 hours instead of every 6 hours.  If pain still persists that is severe and uncontrolled then would recommend taking 2 pain pills every 6 hours.  Try to take pain medicine sparingly but use as needed.  If pain still persist that is worsening and uncontrolled with medication and please call physician.  Take antibiotics and aspirin as prescribed.  Take Zofran antinausea medication as prescribed as needed.  Begin taking aspirin tomorrow.  2. Keep the dressing clean, dry and intact.  Maintain partial weightbearing with heel contact in a boot to the left lower extremity.  Only walk for short distances and try to stay off the left foot is much as possible to ensure proper healing.  You need to be keeping the boot on at all times as well when ambulating.  If up and resting in a chair though you may remove the boot.  It is probably recommended to sleep in the boot as it will protect your foot better.  3. Keep your foot elevated above the heart level for the first 48 hours.  Continue elevation after that time to ensure reduction in swelling.  4. Walking to the bathroom and brief periods of walking are acceptable, unless we have instructed you to be non-weight bearing.  5. Always wear your post-op shoe when walking.  Always use your crutches if you are to be non-weight bearing.  6. Do not take a shower. Baths are permissible as long as the foot is kept out of the water.   7. Every hour you are awake:  - Bend your knee 15 times. - Flex foot at ankle 15 times - Massage calf 15 times  8. [facility] [phone]) if any of the following problems occur: - You develop a temperature or fever. - The bandage becomes saturated with blood. - Medication does not stop your pain. - Injury of the foot occurs. - Any symptoms of infection including redness, odor, or red streaks running from wound.   General Anesthesia, Adult, Care After This sheet gives you information about how to care for yourself after your procedure. Your health care provider may also give you more specific instructions. If you have problems or questions, contact your health care provider. What can I expect after the procedure? After the procedure, the following side effects are common:  Pain or discomfort at the IV site.  Nausea.  Vomiting.  Sore throat.  Trouble concentrating.  Feeling cold or chills.  Feeling weak or tired.  Sleepiness and fatigue.  Soreness and body aches. These side effects can affect parts of the body that were not involved in surgery. Follow these instructions at home: For the time period you were told by your health care provider:  Rest.  Do not participate in activities where you could fall or become injured.  Do not drive or use machinery.  Do not drink alcohol.  Do not take sleeping pills or medicines that cause drowsiness.  Do not make important decisions or sign legal documents.  Do not take care of children on your own.   Eating and drinking  Follow  any instructions from your health care provider about eating or drinking restrictions.  When you feel hungry, start by eating small amounts of foods that are soft and easy to digest (bland), such as toast. Gradually return to your regular diet.  Drink enough fluid to keep your urine pale yellow.  If you vomit, rehydrate by drinking water, juice, or clear broth. General instructions  If you have sleep apnea, surgery and certain medicines can increase your risk for breathing problems. Follow instructions from your  health care provider about wearing your sleep device: ? Anytime you are sleeping, including during daytime naps. ? While taking prescription pain medicines, sleeping medicines, or medicines that make you drowsy.  Have a responsible adult stay with you for the time you are told. It is important to have someone help care for you until you are awake and alert.  Return to your normal activities as told by your health care provider. Ask your health care provider what activities are safe for you.  Take over-the-counter and prescription medicines only as told by your health care provider.  If you smoke, do not smoke without supervision.  Keep all follow-up visits as told by your health care provider. This is important. Contact a health care provider if:  You have nausea or vomiting that does not get better with medicine.  You cannot eat or drink without vomiting.  You have pain that does not get better with medicine.  You are unable to pass urine.  You develop a skin rash.  You have a fever.  You have redness around your IV site that gets worse. Get help right away if:  You have difficulty breathing.  You have chest pain.  You have blood in your urine or stool, or you vomit blood. Summary  After the procedure, it is common to have a sore throat or nausea. It is also common to feel tired.  Have a responsible adult stay with you for the time you are told. It is important to have someone help care for you until you are awake and alert.  When you feel hungry, start by eating small amounts of foods that are soft and easy to digest (bland), such as toast. Gradually return to your regular diet.  Drink enough fluid to keep your urine pale yellow.  Return to your normal activities as told by your health care provider. Ask your health care provider what activities are safe for you. This information is not intended to replace advice given to you by your health care provider. Make sure you  discuss any questions you have with your health care provider. Document Revised: 11/08/2019 Document Reviewed: 06/07/2019 Elsevier Patient Education  2021 Elsevier Inc.  Scopolamine skin patches REMOVE PATCH IN 72 HOURS AND WASH HANDS IMMEDIATELY What is this medicine? SCOPOLAMINE (skoe POL a meen) is used to prevent nausea and vomiting caused by motion sickness, anesthesia and surgery. This medicine may be used for other purposes; ask your health care provider or pharmacist if you have questions. COMMON BRAND NAME(S): Transderm Scop What should I tell my health care provider before I take this medicine? They need to know if you have any of these conditions:  are scheduled to have a gastric secretion test  glaucoma  heart disease  kidney disease  liver disease  lung or breathing disease, like asthma  mental illness  prostate disease  seizures  stomach or intestine problems  trouble passing urine  an unusual or allergic reaction to scopolamine, atropine,  other medicines, foods, dyes, or preservatives  pregnant or trying to get pregnant  breast-feeding How should I use this medicine? This medicine is for external use only. Follow the directions on the prescription label. Wear only 1 patch at a time. Choose an area behind the ear, that is clean, dry, hairless and free from any cuts or irritation. Wipe the area with a clean dry tissue. Peel off the plastic backing of the skin patch, trying not to touch the adhesive side with your hands. Do not cut the patches. Firmly apply to the area you have chosen, with the metallic side of the patch to the skin and the tan-colored side showing. Once firmly in place, wash your hands well with soap and water. Do not get this medicine into your eyes. After removing the patch, wash your hands and the area behind your ear thoroughly with soap and water. The patch will still contain some medicine after use. To avoid accidental contact or  ingestion by children or pets, fold the used patch in half with the sticky side together and throw away in the trash out of the reach of children and pets. If you need to use a second patch after you remove the first, place it behind the other ear. A special MedGuide will be given to you by the pharmacist with each prescription and refill. Be sure to read this information carefully each time. Talk to your pediatrician regarding the use of this medicine in children. Special care may be needed. Overdosage: If you think you have taken too much of this medicine contact a poison control center or emergency room at once. NOTE: This medicine is only for you. Do not share this medicine with others. What if I miss a dose? This does not apply. This medicine is not for regular use. What may interact with this medicine?  alcohol  antihistamines for allergy cough and cold  atropine  certain medicines for anxiety or sleep  certain medicines for bladder problems like oxybutynin, tolterodine  certain medicines for depression like amitriptyline, fluoxetine, sertraline  certain medicines for stomach problems like dicyclomine, hyoscyamine  certain medicines for Parkinson's disease like benztropine, trihexyphenidyl  certain medicines for seizures like phenobarbital, primidone  general anesthetics like halothane, isoflurane, methoxyflurane, propofol  ipratropium  local anesthetics like lidocaine, pramoxine, tetracaine  medicines that relax muscles for surgery  phenothiazines like chlorpromazine, mesoridazine, prochlorperazine, thioridazine  narcotic medicines for pain  other belladonna alkaloids This list may not describe all possible interactions. Give your health care provider a list of all the medicines, herbs, non-prescription drugs, or dietary supplements you use. Also tell them if you smoke, drink alcohol, or use illegal drugs. Some items may interact with your medicine. What should I watch  for while using this medicine? Limit contact with water while swimming and bathing because the patch may fall off. If the patch falls off, throw it away and put a new one behind the other ear. You may get drowsy or dizzy. Do not drive, use machinery, or do anything that needs mental alertness until you know how this medicine affects you. Do not stand or sit up quickly, especially if you are an older patient. This reduces the risk of dizzy or fainting spells. Alcohol may interfere with the effect of this medicine. Avoid alcoholic drinks. Your mouth may get dry. Chewing sugarless gum or sucking hard candy, and drinking plenty of water may help. Contact your healthcare professional if the problem does not go away or is  severe. This medicine may cause dry eyes and blurred vision. If you wear contact lenses, you may feel some discomfort. Lubricating drops may help. See your healthcare professional if the problem does not go away or is severe. If you are going to need surgery, an MRI, CT scan, or other procedure, tell your healthcare professional that you are using this medicine. You may need to remove the patch before the procedure. What side effects may I notice from receiving this medicine? Side effects that you should report to your doctor or health care professional as soon as possible:  allergic reactions like skin rash, itching or hives; swelling of the face, lips, or tongue  blurred vision  changes in vision  confusion  dizziness  eye pain  fast, irregular heartbeat  hallucinations, loss of contact with reality  nausea, vomiting  pain or trouble passing urine  restlessness  seizures  skin irritation  stomach pain Side effects that usually do not require medical attention (report to your doctor or health care professional if they continue or are bothersome):  drowsiness  dry mouth  headache  sore throat This list may not describe all possible side effects. Call your  doctor for medical advice about side effects. You may report side effects to FDA at 1-800-FDA-1088. Where should I keep my medicine? Keep out of the reach of children. Store at room temperature between 20 and 25 degrees C (68 and 77 degrees F). Keep this medicine in the foil package until ready to use. Throw away any unused medicine after the expiration date. NOTE: This sheet is a summary. It may not cover all possible information. If you have questions about this medicine, talk to your doctor, pharmacist, or health care provider.  2021 Elsevier/Gold Standard (2017-05-13 16:14:46)  Information for Discharge Teaching: EXPAREL (bupivacaine liposome injectable suspension)   Your surgeon or anesthesiologist gave you EXPAREL(bupivacaine) to help control your pain after surgery.   EXPAREL is a local anesthetic that provides pain relief by numbing the tissue around the surgical site.  EXPAREL is designed to release pain medication over time and can control pain for up to 72 hours.  Depending on how you respond to EXPAREL, you may require less pain medication during your recovery.  Possible side effects:  Temporary loss of sensation or ability to move in the area where bupivacaine was injected.  Nausea, vomiting, constipation  Rarely, numbness and tingling in your mouth or lips, lightheadedness, or anxiety may occur.  Call your doctor right away if you think you may be experiencing any of these sensations, or if you have other questions regarding possible side effects.  Follow all other discharge instructions given to you by your surgeon or nurse. Eat a healthy diet and drink plenty of water or other fluids.  If you return to the hospital for any reason within 96 hours following the administration of EXPAREL, it is important for health care providers to know that you have received this anesthetic. A teal colored band has been placed on your arm with the date, time and amount of EXPAREL you  have received in order to alert and inform your health care providers. Please leave this armband in place for the full 96 hours following administration, and then you may remove the band.

## 2020-07-17 NOTE — Anesthesia Procedure Notes (Signed)
Procedure Name: LMA Insertion Date/Time: 07/17/2020 10:49 AM Performed by: Jimmy Picket, CRNA Pre-anesthesia Checklist: Patient identified, Emergency Drugs available, Suction available, Timeout performed and Patient being monitored Patient Re-evaluated:Patient Re-evaluated prior to induction Oxygen Delivery Method: Circle system utilized Preoxygenation: Pre-oxygenation with 100% oxygen Induction Type: IV induction LMA: LMA inserted LMA Size: 4.0 Number of attempts: 1 Placement Confirmation: positive ETCO2 and breath sounds checked- equal and bilateral Tube secured with: Tape

## 2020-07-17 NOTE — Anesthesia Preprocedure Evaluation (Signed)
Anesthesia Evaluation  Patient identified by MRN, date of birth, ID band Patient awake    Reviewed: Allergy & Precautions, H&P , NPO status , Patient's Chart, lab work & pertinent test results  Airway Mallampati: II  TM Distance: >3 FB Neck ROM: full    Dental no notable dental hx.    Pulmonary    Pulmonary exam normal breath sounds clear to auscultation       Cardiovascular Normal cardiovascular exam Rhythm:regular Rate:Normal     Neuro/Psych  Headaches,    GI/Hepatic   Endo/Other  Morbid obesity  Renal/GU      Musculoskeletal   Abdominal   Peds  Hematology   Anesthesia Other Findings   Reproductive/Obstetrics                             Anesthesia Physical Anesthesia Plan  ASA: II  Anesthesia Plan: General LMA   Post-op Pain Management:    Induction:   PONV Risk Score and Plan: 3 and Treatment may vary due to age or medical condition, Ondansetron, Dexamethasone and Scopolamine patch - Pre-op  Airway Management Planned:   Additional Equipment:   Intra-op Plan:   Post-operative Plan:   Informed Consent: I have reviewed the patients History and Physical, chart, labs and discussed the procedure including the risks, benefits and alternatives for the proposed anesthesia with the patient or authorized representative who has indicated his/her understanding and acceptance.     Dental Advisory Given  Plan Discussed with: CRNA  Anesthesia Plan Comments:         Anesthesia Quick Evaluation

## 2020-07-17 NOTE — Anesthesia Postprocedure Evaluation (Signed)
Anesthesia Post Note  Patient: Diane Sanchez  Procedure(s) Performed: [name]- LEFT with Akin osteotomy (Left Toe)     Patient location during evaluation: PACU Anesthesia Type: General Level of consciousness: awake and alert and oriented Pain management: satisfactory to patient Vital Signs Assessment: post-procedure vital signs reviewed and stable Respiratory status: spontaneous breathing, nonlabored ventilation and respiratory function stable Cardiovascular status: blood pressure returned to baseline and stable Postop Assessment: Adequate PO intake and No signs of nausea or vomiting Anesthetic complications: no   No complications documented.  Cherly Beach

## 2020-07-18 ENCOUNTER — Encounter: Payer: Self-pay | Admitting: Podiatry

## 2022-05-26 ENCOUNTER — Other Ambulatory Visit: Payer: Self-pay

## 2022-05-26 ENCOUNTER — Emergency Department: Payer: 59

## 2022-05-26 ENCOUNTER — Emergency Department
Admission: EM | Admit: 2022-05-26 | Discharge: 2022-05-26 | Disposition: A | Discharge status: Home / Self Care | Payer: 59 | Attending: Emergency Medicine | Admitting: Emergency Medicine

## 2022-05-26 DIAGNOSIS — N921 Excessive and frequent menstruation with irregular cycle: Secondary | ICD-10-CM

## 2022-05-26 DIAGNOSIS — E876 Hypokalemia: Secondary | ICD-10-CM | POA: Insufficient documentation

## 2022-05-26 LAB — CBC
HCT: 38.5 % (ref 36.0–46.0)
Hemoglobin: 12.6 g/dL (ref 12.0–15.0)
MCH: 28.4 pg (ref 26.0–34.0)
MCHC: 32.7 g/dL (ref 30.0–36.0)
MCV: 86.9 fL (ref 80.0–100.0)
Platelets: 366 10*3/uL (ref 150–400)
RBC: 4.43 MIL/uL (ref 3.87–5.11)
RDW: 12.6 % (ref 11.5–15.5)
WBC: 11.1 10*3/uL — ABNORMAL HIGH (ref 4.0–10.5)
nRBC: 0 % (ref 0.0–0.2)

## 2022-05-26 LAB — BASIC METABOLIC PANEL
Anion gap: 4 — ABNORMAL LOW (ref 5–15)
BUN: 23 mg/dL — ABNORMAL HIGH (ref 6–20)
CO2: 28 mmol/L (ref 22–32)
Calcium: 8.9 mg/dL (ref 8.9–10.3)
Chloride: 108 mmol/L (ref 98–111)
Creatinine, Ser: 1.12 mg/dL — ABNORMAL HIGH (ref 0.44–1.00)
GFR, Estimated: >60 mL/min (ref >60)
Glucose, Bld: 86 mg/dL (ref 70–99)
Potassium: 3.1 mmol/L — ABNORMAL LOW (ref 3.5–5.1)
Sodium: 140 mmol/L (ref 135–145)

## 2022-05-26 LAB — TSH: TSH: 3.736 u[IU]/mL (ref 0.350–4.500)

## 2022-05-26 MED ORDER — MEDROXYPROGESTERONE ACETATE 5 MG PO TABS: 10.0000 mg | ORAL_TABLET | Freq: Every day | ORAL | 0 refills | Status: AC

## 2022-05-26 NOTE — Discharge Instructions (Signed)
.  Follow-up with your regular doctor  Return to the emergency department if worsening Take medication as prescribed

## 2022-05-26 NOTE — ED Provider Notes (Signed)
[facility] Provider Note    Event Date/Time   First MD Initiated Contact with Patient 05/26/22 0745     (approximate)   History   Vaginal Bleeding   HPI  Diane Sanchez is a 37 y.o. female with history of migraine headaches presents emergency department complaining of heavy vaginal bleeding for approximately 17 days.  Patient states that she was recently told by her PCP that she has thyroid problems that it was overactive.  Patient is complaining that the bleeding has gotten heavier over the last 2 days and is just flowing out of her.  States she is going through about a pad an hour.  No recent pregnancy.  Never had this problem before.      Physical Exam   Triage Vital Signs: ED Triage Vitals  Enc Vitals Group     BP 05/26/22 0729 (!) 150/85     Pulse Rate 05/26/22 0729 69     Resp 05/26/22 0729 18     Temp 05/26/22 0729 98 F (36.7 C)     Temp src --      SpO2 05/26/22 0729 99 %     Weight --      Height --      Head Circumference --      Peak Flow --      Pain Score 05/26/22 0728 9     Pain Loc --      Pain Edu? --      Excl. in [place]? --     Most recent vital signs: Vitals:   05/26/22 0729  BP: (!) 150/85  Pulse: 69  Resp: 18  Temp: 98 F (36.7 C)  SpO2: 99%     General: Awake, no distress.   CV:  Good peripheral perfusion. regular rate and  rhythm Resp:  Normal effort.  Abd:  No distention.  Nontender Other:  Pelvic exam shows some clots in the vaginal vault, no hemorrhaging noted   ED Results / Procedures / Treatments   Labs (all labs ordered are listed, but only abnormal results are displayed) Labs Reviewed  CBC - Abnormal; Notable for the following components:      Result Value   WBC 11.1 (*)    All other components within normal limits  BASIC METABOLIC PANEL - Abnormal; Notable for the following components:   Potassium 3.1 (*)    BUN 23 (*)    Creatinine, Ser 1.12 (*)    Anion gap 4 (*)    All other  components within normal limits  TSH  POC URINE PREG, ED     EKG     RADIOLOGY Ultrasound pelvis    PROCEDURES:   Procedures   MEDICATIONS ORDERED IN ED: Medications - No data to display   IMPRESSION / MDM / [place] / ED COURSE  I reviewed the triage vital signs and the nursing notes.                              Differential diagnosis includes, but is not limited to, menorrhagia, hypothyroidism, fibroids, retained products of pregnancy, hemorrhage  Patient's presentation is most consistent with acute presentation with potential threat to life or bodily function.   Labs and imaging ordered   Labs are reassuring, patient is not hemorrhaging does not need blood transfusion  Ultrasound of the pelvis independently reviewed and interpreted by me as being negative for any acute abnormality.  No fibroids noted  I did explain this to the patient.  Placed her on methyl progesterone 10 mg daily for 5 days.  She is to follow-up with her regular doctor.  Return emergency department worsening.  In agreement treatment plan.  I did inform her she is not in need of blood transfusion and feel that she is hemodynamically stable.  She was discharged stable condition.   FINAL CLINICAL IMPRESSION(S) / ED DIAGNOSES   Final diagnoses:  Menorrhagia with irregular cycle     Rx / DC Orders   ED Discharge Orders          Ordered    medroxyPROGESTERone (PROVERA) 5 MG tablet  Daily        05/26/22 0949             Note:  This document was prepared using Dragon voice recognition software and may include unintentional dictation errors.    Versie Starks, PA-C 05/26/22 [id number]    Harvest Dark, MD 05/26/22 1327

## 2022-05-26 NOTE — ED Notes (Signed)
Green and lav sent to lab 

## 2022-05-26 NOTE — ED Notes (Signed)
See triage note. Pt to ED for prolonged vaginal bleeding. Provider at bedside for exam. Called lab to confirm if green top tube sent. They found it and will run BMP and TSH.

## 2022-05-26 NOTE — ED Triage Notes (Signed)
Pt comes with c/o vaginal bleeding for 17 days now. Pt states no hx of period lasting this long. Pt states now having large clots and some cramping. Pt states the cramping started 4 days ago.

## 2022-05-31 ENCOUNTER — Ambulatory Visit
Admission: EM | Admit: 2022-05-31 | Discharge: 2022-05-31 | Disposition: A | Discharge status: Home / Self Care | Payer: 59 | Attending: Physician Assistant | Admitting: Physician Assistant

## 2022-05-31 DIAGNOSIS — R102 Pelvic and perineal pain: Secondary | ICD-10-CM | POA: Diagnosis not present

## 2022-05-31 DIAGNOSIS — N920 Excessive and frequent menstruation with regular cycle: Secondary | ICD-10-CM

## 2022-05-31 LAB — CBC WITH DIFFERENTIAL/PLATELET
Abs Immature Granulocytes: 0.04 10*3/uL (ref 0.00–0.07)
Basophils Absolute: 0.1 10*3/uL (ref 0.0–0.1)
Basophils Relative: 1 %
Eosinophils Absolute: 0.2 10*3/uL (ref 0.0–0.5)
Eosinophils Relative: 1 %
HCT: 34.4 % — ABNORMAL LOW (ref 36.0–46.0)
Hemoglobin: 11.5 g/dL — ABNORMAL LOW (ref 12.0–15.0)
Immature Granulocytes: 0 %
Lymphocytes Relative: 36 %
Lymphs Abs: 4.1 10*3/uL — ABNORMAL HIGH (ref 0.7–4.0)
MCH: 28.8 pg (ref 26.0–34.0)
MCHC: 33.4 g/dL (ref 30.0–36.0)
MCV: 86.2 fL (ref 80.0–100.0)
Monocytes Absolute: 0.6 10*3/uL (ref 0.1–1.0)
Monocytes Relative: 5 %
Neutro Abs: 6.5 10*3/uL (ref 1.7–7.7)
Neutrophils Relative %: 57 %
Platelets: 354 10*3/uL (ref 150–400)
RBC: 3.99 MIL/uL (ref 3.87–5.11)
RDW: 13 % (ref 11.5–15.5)
WBC: 11.5 10*3/uL — ABNORMAL HIGH (ref 4.0–10.5)
nRBC: 0 % (ref 0.0–0.2)

## 2022-05-31 NOTE — ED Provider Notes (Signed)
[facility]    CSN: [id number] Arrival date & time: 05/31/22  [id number]      History   Chief Complaint Chief Complaint  Patient presents with   Back Pain    HPI Diane Sanchez is a 37 y.o. female presenting for 3-week history of lower abdominal/pelvic pain and cramping as well as low back pain.  Patient says pain is constant.  Rates it 9 out of 10 and says its gotten a little worse than it was initially.  Also reports heavy vaginal bleeding with clots and clumps of blood.  States she goes through about 1 sanitary napkin an hour.  Denies fever, nausea/vomiting, dysuria, frequency or urgency.  No history of similar problems.  Reports thyroid issues but that is currently stable.  Denies taking any contraceptives.  Has had a history of tubal ligation.  Reports that she is sexually active with 1 partner but it has been "a really long time."  States this partner is her husband but they have not had sexual intercourse with each other in a while.  She denies any concern for STIs.  Has not had any vaginal discharge, itching or odor.  Denies vaginal lesions.  Reports that she has not been checked for STIs and declines to have that checked today because she has no concerns about it.  She has no history of endometriosis, fibroids, endometrial cancer, cervical cancer.  No family history of any major gynecological conditions.  Patient seen on 05/17/2022 by primary care provider for complaints of heavy menstrual bleeding.  At that time she had CBC, CMP, TSH and lipid panel performed and was given a referral to gynecology for complaints.  Gynecology appointment set for 06/16/2022.  Patient seen at [facility] urgent care on 05/21/2022 for abdominal cramping, heavy and irregular menstrual periods.  Urine pregnancy test performed and negative.  Placed on medroxyprogesterone x 10 days since she had been on menstrual period since 05/08/22. Advised to follow up with gynecology.   Patient seen at [facility] emergency  department on 05/26/2022.  At that time had BMP, TSH, CBC and complete pelvic ultrasound performed.  Ultrasound without any acute abnormalities and labs are reassuring.  Patient placed on methylprogesterone 10 mg x 5 days and advised to follow-up with PCP or return to emergency department if condition worsens.  Last PAP 07/28/21 and normal.   Patient says she did take the prescriptions given to help stop her menstrual period but says they "made it worse."  She also reports taking OTC medication to help with the pain.  Reporting some fatigue and dizziness that started over the past few days.  Patient is concerned about her ongoing symptoms and says her gynecological appointment is not for couple more weeks.  HPI  Past Medical History:  Diagnosis Date   Family history of adverse reaction to anesthesia    Mother - palpataions   Heart murmur    childhood   Migraine headache    chronic    There are no problems to display for this patient.   Past Surgical History:  Procedure Laterality Date   BUNIONECTOMY Left 07/17/2020   Procedure: AUSTIN (MITCHELL)- LEFT with Akin osteotomy;  Surgeon: Caroline More, DPM;  Location: [facility];  Service: Podiatry;  Laterality: Left;  ANESTHESIA- CHOICE   CESAREAN SECTION     CHOLECYSTECTOMY     EYE SURGERY     Retina surgery   TUBAL LIGATION      OB History   No obstetric history  on file.      Home Medications    Prior to Admission medications   Medication Sig Start Date End Date Taking? Authorizing Provider  medroxyPROGESTERone (PROVERA) 5 MG tablet Take 2 tablets (10 mg total) by mouth daily for 5 days. 05/26/22 05/31/22  Fisher, Linden Dolin, PA-C  omeprazole (PRILOSEC) 40 MG capsule Take 40 mg by mouth daily as needed.    [provider]  ondansetron (ZOFRAN) 4 MG tablet Take 1 tablet (4 mg total) by mouth every 8 (eight) hours as needed for nausea or vomiting. 07/17/20   Caroline More, DPM  rizatriptan (MAXALT) 10 MG tablet Take  10 mg by mouth as needed for migraine. May repeat in 2 hours if needed    [provider]  SUMAtriptan (IMITREX) 25 MG tablet Take 25 mg by mouth every 2 (two) hours as needed for migraine. May repeat in 2 hours if headache persists or recurs.    [provider]  topiramate (TOPAMAX) 100 MG tablet Take 100 mg by mouth 2 (two) times daily as needed.    [provider]    Family History History reviewed. No pertinent family history.  Social History Social History   Tobacco Use   Smoking status: Never   Smokeless tobacco: Never  Vaping Use   Vaping Use: Never used  Substance Use Topics   Alcohol use: Not Currently     Allergies   Patient has no known allergies.   Review of Systems Review of Systems  Constitutional:  Positive for fatigue. Negative for fever.  Respiratory:  Negative for shortness of breath.   Cardiovascular:  Negative for chest pain and palpitations.  Gastrointestinal:  Positive for abdominal pain. Negative for diarrhea and vomiting.  Genitourinary:  Positive for menstrual problem, pelvic pain and vaginal bleeding. Negative for difficulty urinating, dysuria, frequency, genital sores, vaginal discharge and vaginal pain.  Musculoskeletal:  Positive for back pain.  Skin:  Negative for rash.  Neurological:  Positive for dizziness. Negative for syncope, weakness and headaches.     Physical Exam Triage Vital Signs ED Triage Vitals  Enc Vitals Group     BP      Pulse      Resp      Temp      Temp src      SpO2      Weight      Height      Head Circumference      Peak Flow      Pain Score      Pain Loc      Pain Edu?      Excl. in [place]?    No data found.  Updated Vital Signs BP 134/87 (BP Location: Left Arm)   Pulse 71   Temp 98.3 F (36.8 C) (Oral)   Resp 17   LMP 05/08/2022 (Approximate)   SpO2 99%      Physical Exam Vitals and nursing note reviewed.  Constitutional:      General: She is not in acute distress.     Appearance: Normal appearance. She is not ill-appearing or toxic-appearing.  HENT:     Head: Normocephalic and atraumatic.     Nose: Nose normal.     Mouth/Throat:     Mouth: Mucous membranes are moist.     Pharynx: Oropharynx is clear.  Eyes:     General: No scleral icterus.       Right eye: No discharge.  Left eye: No discharge.     Conjunctiva/sclera: Conjunctivae normal.  Cardiovascular:     Rate and Rhythm: Normal rate and regular rhythm.     Heart sounds: Normal heart sounds.  Pulmonary:     Effort: Pulmonary effort is normal. No respiratory distress.     Breath sounds: Normal breath sounds.  Abdominal:     Palpations: Abdomen is soft.     Tenderness: There is abdominal tenderness (diffusely throughout lower abdomen/pelvis). There is no right CVA tenderness or left CVA tenderness.  Musculoskeletal:     Cervical back: Neck supple.  Skin:    General: Skin is dry.  Neurological:     General: No focal deficit present.     Mental Status: She is alert. Mental status is at baseline.     Motor: No weakness.     Gait: Gait normal.  Psychiatric:        Mood and Affect: Mood normal.        Behavior: Behavior normal.        Thought Content: Thought content normal.      UC Treatments / Results  Labs (all labs ordered are listed, but only abnormal results are displayed) Labs Reviewed  CBC WITH DIFFERENTIAL/PLATELET - Abnormal; Notable for the following components:      Result Value   WBC 11.5 (*)    Hemoglobin 11.5 (*)    HCT 34.4 (*)    Lymphs Abs 4.1 (*)    All other components within normal limits    EKG   Radiology No results found.  Procedures Procedures (including critical care time)  Medications Ordered in UC Medications - No data to display  Initial Impression / Assessment and Plan / UC Course  I have reviewed the triage vital signs and the nursing notes.  Pertinent labs & imaging results that were available during my care of the patient were  reviewed by me and considered in my medical decision making (see chart for details).   37 year old female presents for 3-week history of lower abdominal/pelvic cramping pain and heavy vaginal bleeding.  Has been seen by PCP, urgent care and emergency department all in the past 2 weeks.  Has had lab work and pelvic ultrasound performed without any answers to the cause of her symptoms.  Reports symptoms continue and have gotten a little worse.  Reports continued vaginal bleeding despite trying hormonal medication to cease menstrual.  Upcoming appointment with gynecology on 06/16/2022.  Vitals normal and stable.  Patient is overall well-appearing.  She has diffuse tenderness throughout the lower abdomen and pelvic region.  No CVA tenderness.  Chest clear to auscultation heart regular rate and rhythm.  I had a discussion with the patient about limitations of urgent care especially given her continued and worsening symptoms and the fact that she is already been seen multiple times for this condition.  Advised her I do not have any additional testing to offer her in the urgent care to work her up.  Advise she needs further workup to assess her complaint and likely sooner than her appointment with gynecology.  Offered to perform a CBC to see if her hemoglobin level has dropped.  She agrees to this.  Advised STI testing but she declined stating it is not a concern.  Contacted [facility] clinic gynecology but they need her previous gynecological notes before they can see her.  Advised patient that she would likely be best going to the emergency department at [facility] or through [facility]  emergency department for further workup at this time and more immediate consult with gynecology.  Hemoglobin today is 11.5.  This is down from 12.6 in the past 5 days.  Patient's appointment with gynecology is not for another 16 days.  If bleeding continues at this rate her hemoglobin will likely be down to around 8.5.  She is  starting to complain of some dizziness and fatigue at this point and says her pain has worsened.  Again I discussed with patient going to the emergency department for further workup.  She does not give me confirmation that she will go to the ER.  I gave her information for [facility] clinic OB/GYN and advised her to reach out to her PCP to see if anyone can get her in to see gynecology sooner than her appointment.  Advised her that I do not recommend she wait any longer and discussed the importance of going to the ER for further workup which we are unable to provide in the urgent care.  -Patient has undiagnosed new problem with uncertain prognosis.   Final Clinical Impressions(s) / UC Diagnoses   Final diagnoses:  Pelvic pain  Menorrhagia with regular cycle     Discharge Instructions      -Your hemoglobin level has come down from 12.5-11.5 in the past 5 days.  And will likely continue decreasing at this rate unless you are menstrual bleeding is under control.  You have already tried a couple different medications that did not work.  We discussed going to the emergency department which would be your best option.  If you do not want to go to the emergency department then try to contact different gynecology clinics.  I listed 1 below that you can call but they will need your previous records before they can see you. -You can also try to call your PCP back to see if they are able to get you in to a different OB/GYN more immediately. - Please consider going to [facility] emergency department or [place] emergency department for further evaluation today.  [facility]: Obstetrics and Gynecology [address]  [phone]  You have been advised to follow up immediately in the emergency department for concerning signs.symptoms. If you declined EMS transport, please have a family member take you directly to the ED at this time. Do not delay. Based on concerns about condition, if you do not follow up in  th e ED, you may risk poor outcomes including worsening of condition, delayed treatment and potentially life threatening issues. If you have declined to go to the ED at this time, you should call your PCP immediately to set up a follow up appointment.  Go to ED for red flag symptoms, including; fevers you cannot reduce with Tylenol/Motrin, severe headaches, vision changes, numbness/weakness in part of the body, lethargy, confusion, intractable vomiting, severe dehydration, chest pain, breathing difficulty, severe persistent abdominal or pelvic pain, signs of severe infection (increased redness, swelling of an area), feeling faint or passing out, dizziness, etc. You should especially go to the ED for sudden acute worsening of condition if you do not elect to go at this time.      ED Prescriptions   None    PDMP not reviewed this encounter.   Danton Clap, PA-C 05/31/22 1008

## 2022-05-31 NOTE — ED Notes (Signed)
Patient is being discharged from the Urgent Care and sent to the Emergency Department via POV . Per Laurene Footman PA, patient is in need of higher level of care due to abdominal pain, vaginal bleeding. Patient is aware and verbalizes understanding of plan of care.  Vitals:   05/31/22 0841  BP: 134/87  Pulse: 71  Resp: 17  Temp: 98.3 F (36.8 C)  SpO2: 99%

## 2022-05-31 NOTE — Discharge Instructions (Addendum)
-  Your hemoglobin level has come down from 12.5-11.5 in the past 5 days.  And will likely continue decreasing at this rate unless you are menstrual bleeding is under control.  You have already tried a couple different medications that did not work.  We discussed going to the emergency department which would be your best option.  If you do not want to go to the emergency department then try to contact different gynecology clinics.  I listed 1 below that you can call but they will need your previous records before they can see you. -You can also try to call your PCP back to see if they are able to get you in to a different OB/GYN more immediately. - Please consider going to [facility] emergency department or [place] emergency department for further evaluation today.  [facility]: Obstetrics and Gynecology [address]  [phone]  You have been advised to follow up immediately in the emergency department for concerning signs.symptoms. If you declined EMS transport, please have a family member take you directly to the ED at this time. Do not delay. Based on concerns about condition, if you do not follow up in th e ED, you may risk poor outcomes including worsening of condition, delayed treatment and potentially life threatening issues. If you have declined to go to the ED at this time, you should call your PCP immediately to set up a follow up appointment.  Go to ED for red flag symptoms, including; fevers you cannot reduce with Tylenol/Motrin, severe headaches, vision changes, numbness/weakness in part of the body, lethargy, confusion, intractable vomiting, severe dehydration, chest pain, breathing difficulty, severe persistent abdominal or pelvic pain, signs of severe infection (increased redness, swelling of an area), feeling faint or passing out, dizziness, etc. You should especially go to the ED for sudden acute worsening of condition if you do not elect to go at this time.

## 2022-05-31 NOTE — ED Triage Notes (Signed)
Pt presents with c/o severe lower back pain and abdominal pain.   States she has had heavy bleeding and clotting x 22 days.

## 2024-04-04 ENCOUNTER — Ambulatory Visit
Admission: EM | Admit: 2024-04-04 | Discharge: 2024-04-04 | Disposition: A | Discharge status: Home / Self Care | Attending: Family Medicine | Admitting: Family Medicine

## 2024-04-04 DIAGNOSIS — R300 Dysuria: Secondary | ICD-10-CM | POA: Insufficient documentation

## 2024-04-04 DIAGNOSIS — R350 Frequency of micturition: Secondary | ICD-10-CM | POA: Insufficient documentation

## 2024-04-04 HISTORY — DX: Essential (primary) hypertension: I10

## 2024-04-04 LAB — POCT URINE DIPSTICK
Bilirubin, UA: NEGATIVE
Glucose, UA: NEGATIVE mg/dL
Nitrite, UA: NEGATIVE
Protein Ur, POC: 100 mg/dL — AB
Spec Grav, UA: >=1.03 — AB
Urobilinogen, UA: 0.2 U/dL
pH, UA: 5.5

## 2024-04-04 MED ORDER — NITROFURANTOIN MONOHYD MACRO 100 MG PO CAPS: 100.0000 mg | ORAL_CAPSULE | Freq: Two times a day (BID) | ORAL | 0 refills | Status: AC

## 2024-04-04 NOTE — Discharge Instructions (Addendum)
 You could have a urinary tract infection. I sent your urine for culture to be sure the antibiotic prescribed will treat your infection. Someone may call you to change antibiotics. Stop by the pharmacy to pick up your prescriptions.  Follow up with your primary care provider or return to the urgent care, if not improving.

## 2024-04-04 NOTE — ED Provider Notes (Signed)
 " [facility]    CSN: [id number] Arrival date & time: 04/04/24  0854      History   Chief Complaint Chief Complaint  Patient presents with   Back Pain     HPI HPI Diane Sanchez is a 39 y.o. female.    Diane Sanchez presents for urinary frequency, lower back pain, hematuria and dysuria that started 4-5 days ago .  Tried ibuprofen and cranberry prior to arrival.  Has  had any antibiotics in last 30 days.   Denies known STI exposure. She is  not currently pregnant.  Patient's last menstrual period was 05/08/2022. Had a hysterectomy.   Has history of kidney stones but has not had one since she was a teenager.    - Abnormal vaginal discharge: no - vaginal odor: no - vaginal bleeding: no - Dysuria: yes  - Hematuria: yes  - Urinary urgency: yes  - Urinary frequency: yes   - Fever: unsure, thinks she may have had one last night  - Abdominal pain: yes  - Pelvic pain: no - Rash/Skin lesions/mouth ulcers: no - Nausea: no  - Vomiting: no  - Back Pain: yes        Past Medical History:  Diagnosis Date   Family history of adverse reaction to anesthesia    Mother - palpataions   Heart murmur    childhood   Hypertension    Migraine headache    chronic    There are no active problems to display for this patient.   Past Surgical History:  Procedure Laterality Date   ABDOMINAL HYSTERECTOMY     BUNIONECTOMY Left 07/17/2020   Procedure: AUSTIN (MITCHELL)- LEFT with Akin osteotomy;  Surgeon: Lennie Barter, DPM;  Location: [facility];  Service: Podiatry;  Laterality: Left;  ANESTHESIA- CHOICE   CESAREAN SECTION     CHOLECYSTECTOMY     EYE SURGERY     Retina surgery   TUBAL LIGATION      OB History   No obstetric history on file.      Home Medications    Prior to Admission medications  Medication Sig Start Date End Date Taking? Authorizing Provider  hydrochlorothiazide (HYDRODIURIL) 12.5 MG tablet Take 12.5 mg by mouth.  03/09/24  Yes [provider]  lisinopril (ZESTRIL) 40 MG tablet Take 40 mg by mouth. 03/09/24  Yes [provider]  nitrofurantoin , macrocrystal-monohydrate, (MACROBID ) 100 MG capsule Take 1 capsule (100 mg total) by mouth 2 (two) times daily. 04/04/24  Yes Sharnelle Cappelli, DO  medroxyPROGESTERone  (PROVERA ) 5 MG tablet Take 2 tablets (10 mg total) by mouth daily for 5 days. 05/26/22 05/31/22  Fisher, Devere ORN, PA-C  omeprazole (PRILOSEC) 40 MG capsule Take 40 mg by mouth daily as needed.    [provider]  ondansetron  (ZOFRAN ) 4 MG tablet Take 1 tablet (4 mg total) by mouth every 8 (eight) hours as needed for nausea or vomiting. 07/17/20   Lennie Barter, DPM  rizatriptan (MAXALT) 10 MG tablet Take 10 mg by mouth as needed for migraine. May repeat in 2 hours if needed    [provider]  SUMAtriptan (IMITREX) 25 MG tablet Take 25 mg by mouth every 2 (two) hours as needed for migraine. May repeat in 2 hours if headache persists or recurs.    [provider]  topiramate (TOPAMAX) 100 MG tablet Take 100 mg by mouth 2 (two) times daily as needed.    [provider]    Family History  History reviewed. No pertinent family history.  Social History Social History[1]   Allergies   Patient has no known allergies.   Review of Systems Review of Systems: :negative unless otherwise stated in HPI.      Physical Exam Triage Vital Signs ED Triage Vitals  Encounter Vitals Group     BP      Girls Systolic BP Percentile      Girls Diastolic BP Percentile      Boys Systolic BP Percentile      Boys Diastolic BP Percentile      Pulse      Resp      Temp      Temp src      SpO2      Weight      Height      Head Circumference      Peak Flow      Pain Score      Pain Loc      Pain Education      Exclude from Growth Chart    No data found.  Updated Vital Signs BP 130/73   Pulse 73   Temp 98.3 F (36.8 C)   Resp 18   Wt 90.8 kg   LMP  05/08/2022   SpO2 100%   BMI 34.36 kg/m   Visual Acuity Right Eye Distance:   Left Eye Distance:   Bilateral Distance:    Right Eye Near:   Left Eye Near:    Bilateral Near:     Physical Exam GEN: well appearing female in no acute distress  CVS: well perfused  RESP: speaking in full sentences without pause  ABD: soft, suprapubic tenderness to palpation, non-distended, no palpable masses, left CVA tenderness      UC Treatments / Results  Labs (all labs ordered are listed, but only abnormal results are displayed) Labs Reviewed  POCT URINE DIPSTICK - Abnormal; Notable for the following components:      Result Value   Ketones, POC UA moderate (40) (*)    Spec Grav, UA >=1.030 (*)    Blood, UA moderate (*)    Protein Ur, POC =100 (*)    Leukocytes, UA Trace (*)    All other components within normal limits  URINE CULTURE    EKG   Radiology No results found.  Procedures Procedures (including critical care time)  Medications Ordered in UC Medications - No data to display  Initial Impression / Assessment and Plan / UC Course  I have reviewed the triage vital signs and the nursing notes.  Pertinent labs & imaging results that were available during my care of the patient were reviewed by me and considered in my medical decision making (see chart for details).       Patient is a 39 y.o. female  who presents for 4-5 days of dysuria and urinary frequency.  Overall patient is well-appearing and afebrile.  Vital signs stable.   POC urine dipstick and exam concerning for acute cystitis.   Hematuria present but no  microscopy available.   Treat with Macrobid  2 times daily for 5 days.  Urine culture obtained.  Follow-up sensitivities and change antibiotics, if needed.   Return precautions including abdominal pain, fever, chills, nausea, or vomiting given. Follow-up,  if symptoms not improving or getting worse. Discussed MDM, treatment plan and plan for follow-up with patient  who agrees with plan.        Final Clinical Impressions(s) / UC Diagnoses   Final  diagnoses:  Urinary frequency  Dysuria     Discharge Instructions      You could have a urinary tract infection. I sent your urine for culture to be sure the antibiotic prescribed will treat your infection. Someone may call you to change antibiotics. Stop by the pharmacy to pick up your prescriptions.  Follow up with your primary care provider or return to the urgent care, if not improving.       ED Prescriptions     Medication Sig Dispense Auth. Provider   nitrofurantoin , macrocrystal-monohydrate, (MACROBID ) 100 MG capsule Take 1 capsule (100 mg total) by mouth 2 (two) times daily. 10 capsule Geetika Laborde, DO      PDMP not reviewed this encounter.     [1]  Social History Tobacco Use   Smoking status: Never   Smokeless tobacco: Never  Vaping Use   Vaping status: Never Used  Substance Use Topics   Alcohol use: Not Currently   Drug use: Never     Abelino Tippin, DO 04/04/24 0931  "

## 2024-04-04 NOTE — ED Triage Notes (Signed)
 Patient to Urgent Care with complaints of lower back pain/ dysuria/ urinary frequency.  Symptoms x4 days.   Using cranberry and pushing fluids.

## 2024-04-05 ENCOUNTER — Ambulatory Visit (HOSPITAL_COMMUNITY): Payer: Self-pay

## 2024-04-05 LAB — URINE CULTURE: Culture: NO GROWTH
